# Patient Record
Sex: Female | Born: 1935 | Race: White | Hispanic: No | Smoking: Never smoker
Health system: Southern US, Community
[De-identification: ages and names within clinical notes are randomized; demographics above are authoritative.]

## PROBLEM LIST (undated history)

## (undated) DIAGNOSIS — M810 Age-related osteoporosis without current pathological fracture: Secondary | ICD-10-CM

## (undated) DIAGNOSIS — S065XAA Traumatic subdural hemorrhage with loss of consciousness status unknown, initial encounter: Secondary | ICD-10-CM

## (undated) DIAGNOSIS — N189 Chronic kidney disease, unspecified: Secondary | ICD-10-CM

## (undated) DIAGNOSIS — S065X9A Traumatic subdural hemorrhage with loss of consciousness of unspecified duration, initial encounter: Secondary | ICD-10-CM

## (undated) DIAGNOSIS — G309 Alzheimer's disease, unspecified: Secondary | ICD-10-CM

## (undated) DIAGNOSIS — F0280 Dementia in other diseases classified elsewhere without behavioral disturbance: Secondary | ICD-10-CM

## (undated) DIAGNOSIS — F419 Anxiety disorder, unspecified: Secondary | ICD-10-CM

---

## 2011-02-04 ENCOUNTER — Observation Stay (HOSPITAL_COMMUNITY)
Admission: EM | Admit: 2011-02-04 | Discharge: 2011-02-07 | DRG: 057 | Disposition: A | Discharge status: Nursing Home (Includes ICF) | Payer: Medicare Other | Attending: Internal Medicine | Admitting: Internal Medicine

## 2011-02-04 ENCOUNTER — Emergency Department (HOSPITAL_COMMUNITY): Payer: Medicare Other

## 2011-02-04 DIAGNOSIS — Z9183 Wandering in diseases classified elsewhere: Secondary | ICD-10-CM | POA: Insufficient documentation

## 2011-02-04 DIAGNOSIS — F0390 Unspecified dementia without behavioral disturbance: Principal | ICD-10-CM | POA: Insufficient documentation

## 2011-02-04 DIAGNOSIS — R9431 Abnormal electrocardiogram [ECG] [EKG]: Secondary | ICD-10-CM | POA: Insufficient documentation

## 2011-02-04 DIAGNOSIS — M4120 Other idiopathic scoliosis, site unspecified: Secondary | ICD-10-CM | POA: Insufficient documentation

## 2011-02-04 DIAGNOSIS — G319 Degenerative disease of nervous system, unspecified: Secondary | ICD-10-CM | POA: Insufficient documentation

## 2011-02-04 DIAGNOSIS — M47814 Spondylosis without myelopathy or radiculopathy, thoracic region: Secondary | ICD-10-CM | POA: Insufficient documentation

## 2011-02-04 DIAGNOSIS — M899 Disorder of bone, unspecified: Secondary | ICD-10-CM | POA: Insufficient documentation

## 2011-02-04 DIAGNOSIS — I739 Peripheral vascular disease, unspecified: Secondary | ICD-10-CM | POA: Insufficient documentation

## 2011-02-04 DIAGNOSIS — F0280 Dementia in other diseases classified elsewhere without behavioral disturbance: Secondary | ICD-10-CM | POA: Insufficient documentation

## 2011-02-04 LAB — COMPREHENSIVE METABOLIC PANEL
ALT: 15 U/L (ref 0–35)
AST: 27 U/L (ref 0–37)
CO2: 27 mEq/L (ref 19–32)
Calcium: 9.9 mg/dL (ref 8.4–10.5)
Chloride: 103 mEq/L (ref 96–112)
Creatinine, Ser: 0.76 mg/dL (ref 0.4–1.2)
GFR calc non Af Amer: >60 mL/min (ref >60)
Glucose, Bld: 104 mg/dL — ABNORMAL HIGH (ref 70–99)
Sodium: 140 mEq/L (ref 135–145)
Total Bilirubin: 0.7 mg/dL (ref 0.3–1.2)

## 2011-02-04 LAB — URINALYSIS, ROUTINE W REFLEX MICROSCOPIC
Bilirubin Urine: NEGATIVE
Nitrite: NEGATIVE
Urobilinogen, UA: 0.2 mg/dL (ref 0.0–1.0)
pH: 5 (ref 5.0–8.0)

## 2011-02-04 LAB — DIFFERENTIAL
Lymphocytes Relative: 33 % (ref 12–46)
Monocytes Absolute: 0.4 10*3/uL (ref 0.1–1.0)
Monocytes Relative: 8 % (ref 3–12)
Neutro Abs: 2.7 10*3/uL (ref 1.7–7.7)
Neutrophils Relative %: 57 % (ref 43–77)

## 2011-02-04 LAB — CBC
HCT: 39.2 % (ref 36.0–46.0)
Hemoglobin: 13.5 g/dL (ref 12.0–15.0)
MCH: 31.3 pg (ref 26.0–34.0)
MCHC: 34.4 g/dL (ref 30.0–36.0)
RBC: 4.32 MIL/uL (ref 3.87–5.11)

## 2011-02-04 LAB — URINE MICROSCOPIC-ADD ON

## 2011-02-05 LAB — VITAMIN B12: Vitamin B-12: 270 pg/mL (ref 211–911)

## 2011-02-06 LAB — RPR: RPR Ser Ql: NONREACTIVE

## 2011-02-19 NOTE — Discharge Summary (Signed)
Patricia, Duncan              ACCOUNT NO.:  [id number]  MEDICAL RECORD NO.:  [id number]           PATIENT TYPE:  I  LOCATION:  1502                         FACILITY:  [facility]  PHYSICIAN:  Jeoffrey Massed, MD    DATE OF BIRTH:  07-Jan-1924  DATE OF ADMISSION:  02/04/2011 DATE OF DISCHARGE:                        DISCHARGE SUMMARY - REFERRING   PRIMARY CARE PRACTITIONER:  Does not have one.  PRIMARY DISCHARGE DIAGNOSES:  Progressive dementia, likely Alzheimer's.  DISCHARGE MEDICATIONS: 1. Aspirin 81 mg 1 tablet p.o. daily. 2. Colace 100 mg 1 tablet p.o. twice daily. 3. Aricept 5 mg 1 tablet p.o. daily at bedtime. 4. Lorazepam 1 mg p.o. q.6 h. p.r.n.  CONSULTATIONS:  None.  BRIEF HISTORY OF PRESENT ILLNESS:  The patient is a very pleasant 77- year-old female who apparently has a longstanding history of dementia, around 6 years, who was residing in [place] and was moved here to [place] to be closer to one of her sons.  Further history obtained by the admitting physician.  Patient is mostly confused, and this is her baseline, and tends to wander and has left the patient's son's home on a few occasions, requiring some assistance from the police.  Because of the inability to safely care for the patient at home, the patient was brought into the emergency department for admission and placement to a facility.  For further details, please see the history and physical that was dictated by Dr. Amador Cunas on admission.  PERTINENT RADIOLOGICAL STUDIES: 1. CT of the head showed no acute findings.  Advanced small vessel     disease and atrophy. 2. X-ray of the chest showed mild hyperexpansion of the lungs with no     acute abnormality.  PERTINENT LABORATORY DATA: 1. RPR was nonreactive. 2. Vitamin B12 was 270. 3. TSH was 3.204. 4. Urine analysis was unremarkable. 5. CBC shows a WBC of 4.7, hemoglobin of 13.5, hematocrit of 39.2, and     a platelet count of 209. 6. Chemistry  shows a sodium of 140, potassium of 3.5, chloride of 103,     bicarb of 27, glucose of 104, BUN of 15, creatinine of 0.76.  BRIEF HOSPITAL COURSE:  Advanced dementia.  The patient was brought in for altered mental status.  Apparently, this has been going on for the past 6 years and the family was having a difficult time meeting her needs at home safely.  Therefore, she was admitted.  A brief dementia workup was done which is noted as above.  She has been placed on Aricept.  Here in the hospital, her course was pretty uncomplicated. She has required no restraints, as she is pleasantly confused at baseline.  She has had no requirements for lorazepam as far as I can tell, as well.  Current plans are for her to be transferred to an assisted living facility when a bed is available.  DISPOSITION:  Patient will be transferred to an assisted living facility when a bed is available.  CODE STATUS:  Patient is a do not resuscitate/no code blue.  TOTAL TIME SPENT:  25 minutes.     Jeoffrey Massed, MD  SG/MEDQ  D:  02/07/2011  T:  02/07/2011  Job:  [id number]  cc:   Gordy Savers, MD [address] [place] [address]  Electronically Signed by Jeoffrey Massed  on 02/19/2011 04:23:58 PM

## 2011-02-27 NOTE — H&P (Signed)
[name], [name]              ACCOUNT NO.:  [id number]  MEDICAL RECORD NO.:  [id number]           PATIENT TYPE:  I  LOCATION:  TC08                         FACILITY:  [facility]  PHYSICIAN:  Gordy Savers, MDDATE OF BIRTH:  02-18-1924  DATE OF ADMISSION:  02/04/2011 DATE OF DISCHARGE:                             HISTORY & PHYSICAL   CHIEF COMPLAINT:  Altered mental status.  HISTORY OF PRESENT ILLNESS:  The patient is an 77 year old female, who has a history of progressive dementia over for at least the past 6 years.  For the past week, she has been residing with her son and has been a resident of [place].  The patient's mental status has progressed to the point where she has become much more confused and at times aggressive.  She tends to wander and has left their home and has required some assistance from the police to locate the patient.  She apparently drove a private vehicle until last year when she lost herdriving privileges.  Her primary care provider has prescribed low-dose aspirin and Aricept, which she has refused to take in the past.  She apparently has no significant medical issues and takes no chronic medications.  The son has been in contact with [facility] and due to worsening mental status changes and inability to safely care for the patient at home, the patient was brought to the emergency department for admission and placement.  PAST MEDICAL HISTORY:  Progressive dementia.  SURGICAL HISTORY:  Unclear.  SOCIAL HISTORY:  Resident of [place].  Has been living with her son for the past week.  MEDICATIONS:  She takes no medications.  ALLERGIES:  There are no known drug allergies.  FAMILY HISTORY:  Unobtainable at this time.  REVIEW OF SYSTEMS EXAMINATION:  CONSTITUTIONAL:  Fairly noncontributory. According to the patient's son, she has been medically stable. HEENT:  There has been no history of headaches, visual loss.  The patient does wear  dentures. PULMONARY:  No history of COPD, recent chest pain or shortness of breath. CARDIAC:  No known history of MI, congestive heart failure. GI:  Denies any abdominal pain or change in her bowel habits. GENITOURINARY:  Denies any urinary frequency, dysuria, hematuria. NEUROLOGIC/PSYCHIATRIC:  History of progressive senile dementia.  PHYSICAL EXAMINATION:  GENERAL EXAMINATION:  Revealed a well-developed, thin white female, pleasant, but quite confused. VITAL SIGNS:  Blood pressure of 130/80, pulse rate 72, respiratory rate 12, temperature of 98.4, O2 saturation normal. SKIN:  Warm and dry without rash. HEENT EXAMINATION:  Revealed no signs of head trauma.  Pupil responses were normal.  Extraocular muscles were full.  There is no facial asymmetry.  Oropharynx benign, although exam limited.  Dentures were in place. NECK:  No bruits, adenopathy or neck vein distention. CHEST:  Clear. BREASTS:  Negative. CARDIOVASCULAR:  Normal S1, S2.  No murmurs, gallops. ABDOMEN:  Soft and nontender without organomegaly.  A midline scar noted.  No bruits were appreciated. EXTREMITIES:  Revealed no edema, clubbing or cyanosis.  Pedal pulses were full. NEUROLOGIC EXAMINATION:  Revealed the patient to be alert, but quite confused and confabulatory.  She was not  oriented to place.  Motor exam was nonfocal.  She is able to move all extremities and ambulate without difficulty.  Plantar responses were flexor.  LABORATORY DATA:  Laboratory studies included CBC that revealed a normal white count of 4.7, hemoglobin 13.5.  Chemistries were unremarkable as was the urinalysis.  DIAGNOSTIC STUDIES:  Head CT without contrast revealed advanced small- vessel disease in the periventricular white matter regions and diffuse cortical atrophy.  No acute findings were noted.  Chest x-ray revealed mild hyperinflation of the lungs, but no acute abnormalities.  IMPRESSION: 1. Progressive dementia, probably vascular  dementia. 2. Dependence in all aspects of daily living.  DISPOSITION:  The patient will be admitted to the hospital to assist with nursing home placement.  A social worker consult will be obtained. The patient will be placed on daily aspirin and Aricept 5 mg daily.  DVT prophylaxis will be instituted.  The patient's prior DNR status will be maintained.     Gordy Savers, MD     PFK/MEDQ  D:  02/04/2011  T:  02/04/2011  Job:  [id number]  Electronically Signed by Eleonore Chiquito MD on 02/27/2011 12:27:17 PM

## 2011-03-10 ENCOUNTER — Inpatient Hospital Stay (HOSPITAL_COMMUNITY): Payer: Medicare Other

## 2011-03-10 ENCOUNTER — Inpatient Hospital Stay (HOSPITAL_COMMUNITY)
Admission: EM | Admit: 2011-03-10 | Discharge: 2011-03-16 | DRG: 481 | Disposition: A | Discharge status: Skilled Nursing Facility | Payer: Medicare Other | Attending: Internal Medicine | Admitting: Internal Medicine

## 2011-03-10 ENCOUNTER — Emergency Department (HOSPITAL_COMMUNITY): Payer: Medicare Other

## 2011-03-10 DIAGNOSIS — D62 Acute posthemorrhagic anemia: Secondary | ICD-10-CM | POA: Diagnosis not present

## 2011-03-10 DIAGNOSIS — S72143A Displaced intertrochanteric fracture of unspecified femur, initial encounter for closed fracture: Principal | ICD-10-CM | POA: Diagnosis present

## 2011-03-10 DIAGNOSIS — G309 Alzheimer's disease, unspecified: Secondary | ICD-10-CM | POA: Diagnosis present

## 2011-03-10 DIAGNOSIS — Y9210 Unspecified residential institution as the place of occurrence of the external cause: Secondary | ICD-10-CM | POA: Diagnosis present

## 2011-03-10 DIAGNOSIS — R404 Transient alteration of awareness: Secondary | ICD-10-CM | POA: Diagnosis not present

## 2011-03-10 DIAGNOSIS — S72009A Fracture of unspecified part of neck of unspecified femur, initial encounter for closed fracture: Secondary | ICD-10-CM

## 2011-03-10 DIAGNOSIS — Z66 Do not resuscitate: Secondary | ICD-10-CM | POA: Diagnosis present

## 2011-03-10 DIAGNOSIS — I951 Orthostatic hypotension: Secondary | ICD-10-CM | POA: Diagnosis not present

## 2011-03-10 DIAGNOSIS — F0280 Dementia in other diseases classified elsewhere without behavioral disturbance: Secondary | ICD-10-CM | POA: Diagnosis present

## 2011-03-10 DIAGNOSIS — W19XXXA Unspecified fall, initial encounter: Secondary | ICD-10-CM | POA: Diagnosis present

## 2011-03-10 LAB — BASIC METABOLIC PANEL
BUN: 14 mg/dL (ref 6–23)
CO2: 28 mEq/L (ref 19–32)
Chloride: 104 mEq/L (ref 96–112)
GFR calc non Af Amer: >60 mL/min (ref >60)
Glucose, Bld: 148 mg/dL — ABNORMAL HIGH (ref 70–99)
Potassium: 4.1 mEq/L (ref 3.5–5.1)
Sodium: 137 mEq/L (ref 135–145)

## 2011-03-10 LAB — GLUCOSE, CAPILLARY: Glucose-Capillary: 200 mg/dL — ABNORMAL HIGH (ref 70–99)

## 2011-03-10 LAB — DIFFERENTIAL
Basophils Absolute: 0 10*3/uL (ref 0.0–0.1)
Lymphocytes Relative: 10 % — ABNORMAL LOW (ref 12–46)
Lymphs Abs: 1 10*3/uL (ref 0.7–4.0)
Neutro Abs: 8.7 10*3/uL — ABNORMAL HIGH (ref 1.7–7.7)
Neutrophils Relative %: 84 % — ABNORMAL HIGH (ref 43–77)

## 2011-03-10 LAB — CBC
HCT: 35.3 % — ABNORMAL LOW (ref 36.0–46.0)
Hemoglobin: 11.3 g/dL — ABNORMAL LOW (ref 12.0–15.0)
MCV: 95.4 fL (ref 78.0–100.0)
RBC: 3.7 MIL/uL — ABNORMAL LOW (ref 3.87–5.11)
RDW: 13.6 % (ref 11.5–15.5)
WBC: 10.3 10*3/uL (ref 4.0–10.5)

## 2011-03-11 ENCOUNTER — Inpatient Hospital Stay (HOSPITAL_COMMUNITY): Payer: Medicare Other

## 2011-03-11 LAB — CBC
HCT: 25.1 % — ABNORMAL LOW (ref 36.0–46.0)
Hemoglobin: 8.2 g/dL — ABNORMAL LOW (ref 12.0–15.0)
MCH: 30.8 pg (ref 26.0–34.0)
MCHC: 32.7 g/dL (ref 30.0–36.0)
MCHC: 32 g/dL (ref 30.0–36.0)
MCV: 94.4 fL (ref 78.0–100.0)
Platelets: 170 10*3/uL (ref 150–400)
Platelets: 146 10*3/uL — ABNORMAL LOW (ref 150–400)
RBC: 2.66 MIL/uL — ABNORMAL LOW (ref 3.87–5.11)
RDW: 13.7 % (ref 11.5–15.5)
RDW: 13.7 % (ref 11.5–15.5)
WBC: 13.6 10*3/uL — ABNORMAL HIGH (ref 4.0–10.5)
WBC: 9.7 10*3/uL (ref 4.0–10.5)

## 2011-03-11 LAB — PROTIME-INR
INR: 1.17 (ref 0.00–1.49)
Prothrombin Time: 15.1 seconds (ref 11.6–15.2)

## 2011-03-11 LAB — CARDIAC PANEL(CRET KIN+CKTOT+MB+TROPI)
CK, MB: 5.1 ng/mL — ABNORMAL HIGH (ref 0.3–4.0)
Relative Index: 1.7 (ref 0.0–2.5)
Total CK: 295 U/L — ABNORMAL HIGH (ref 7–177)
Total CK: 327 U/L — ABNORMAL HIGH (ref 7–177)
Troponin I: 0.01 ng/mL (ref 0.00–0.06)

## 2011-03-11 LAB — COMPREHENSIVE METABOLIC PANEL
ALT: 20 U/L (ref 0–35)
AST: 34 U/L (ref 0–37)
CO2: 26 mEq/L (ref 19–32)
Chloride: 103 mEq/L (ref 96–112)
GFR calc Af Amer: >60 mL/min (ref >60)
GFR calc non Af Amer: 60 mL/min — ABNORMAL LOW (ref >60)
Glucose, Bld: 204 mg/dL — ABNORMAL HIGH (ref 70–99)
Sodium: 137 mEq/L (ref 135–145)
Total Bilirubin: 0.6 mg/dL (ref 0.3–1.2)

## 2011-03-12 LAB — IRON AND TIBC
Iron: 20 ug/dL — ABNORMAL LOW (ref 42–135)
UIBC: 165 ug/dL

## 2011-03-12 LAB — CBC
HCT: 19 % — ABNORMAL LOW (ref 36.0–46.0)
Hemoglobin: 6.1 g/dL — CL (ref 12.0–15.0)
MCH: 30 pg (ref 26.0–34.0)
MCHC: 32.1 g/dL (ref 30.0–36.0)
MCV: 93.6 fL (ref 78.0–100.0)

## 2011-03-12 LAB — PROTIME-INR: INR: 1.2 (ref 0.00–1.49)

## 2011-03-12 LAB — ABO/RH: ABO/RH(D): O POS

## 2011-03-12 LAB — HEMOGLOBIN AND HEMATOCRIT, BLOOD
HCT: 25.6 % — ABNORMAL LOW (ref 36.0–46.0)
Hemoglobin: 8.8 g/dL — ABNORMAL LOW (ref 12.0–15.0)

## 2011-03-13 LAB — PROTIME-INR
INR: 1 (ref 0.00–1.49)
Prothrombin Time: 13.4 seconds (ref 11.6–15.2)

## 2011-03-13 LAB — CROSSMATCH
Antibody Screen: NEGATIVE
Unit division: 0

## 2011-03-13 LAB — BASIC METABOLIC PANEL
GFR calc Af Amer: >60 mL/min (ref >60)
GFR calc non Af Amer: >60 mL/min (ref >60)
Potassium: 3.5 mEq/L (ref 3.5–5.1)
Sodium: 138 mEq/L (ref 135–145)

## 2011-03-13 LAB — MAGNESIUM: Magnesium: 2.1 mg/dL (ref 1.5–2.5)

## 2011-03-13 LAB — CBC
Hemoglobin: 9.8 g/dL — ABNORMAL LOW (ref 12.0–15.0)
RBC: 3.27 MIL/uL — ABNORMAL LOW (ref 3.87–5.11)

## 2011-03-14 LAB — PROTIME-INR
INR: 1.04 (ref 0.00–1.49)
Prothrombin Time: 13.8 seconds (ref 11.6–15.2)

## 2011-03-14 LAB — CBC
MCHC: 33.5 g/dL (ref 30.0–36.0)
RDW: 14.7 % (ref 11.5–15.5)

## 2011-03-15 LAB — BASIC METABOLIC PANEL
CO2: 28 mEq/L (ref 19–32)
Chloride: 104 mEq/L (ref 96–112)
GFR calc Af Amer: >60 mL/min (ref >60)
Potassium: 3.9 mEq/L (ref 3.5–5.1)
Sodium: 136 mEq/L (ref 135–145)

## 2011-03-15 LAB — PROTIME-INR: Prothrombin Time: 14.7 seconds (ref 11.6–15.2)

## 2011-03-15 LAB — CBC
MCH: 30.2 pg (ref 26.0–34.0)
Platelets: 166 10*3/uL (ref 150–400)
RBC: 2.81 MIL/uL — ABNORMAL LOW (ref 3.87–5.11)
WBC: 3.8 10*3/uL — ABNORMAL LOW (ref 4.0–10.5)

## 2011-03-15 NOTE — Op Note (Signed)
[name], [name]              ACCOUNT NO.:  [id number]  MEDICAL RECORD NO.:  [id number]           PATIENT TYPE:  I  LOCATION:  1601                         FACILITY:  [facility]  PHYSICIAN:  Alvy Beal, MD    DATE OF BIRTH:  02-17-1924  DATE OF PROCEDURE: DATE OF DISCHARGE:                              OPERATIVE REPORT   PREOPERATIVE DIAGNOSIS:  Left hip comminuted intertrochanteric fracture.  POSTOPERATIVE DIAGNOSIS:  Left hip comminuted intertrochanteric fracture.  OPERATIVE PROCEDURE:  Open reduction and internal fixation with troch nail, left hip.  COMPLICATIONS:  None.  CONDITION:  Stable.  HISTORY:  This is an 77 year old demented woman who fell today and presented to the ER with a left intertrochanteric comminuted hip fracture.  Preoperative medical clearance was obtained and decision was then made to take the patient to the operating room for the aforementioned procedure.  All pertinent risks, benefits and alternatives were discussed with the patient and her son and consent was obtained.  OPERATIVE NOTE:  The patient was brought to the operating room, placed supine on the operating room table.  After successful induction of general anesthesia, endotracheal intubation, the right lower extremity was placed in a well leg holder and flexed in a abductor position.  The left was placed into the traction boot.  A gentle traction distraction maneuver was made to reduce the femur.  Once the hip fracture was reduced, we then prepped and draped the leg in a standard fashion. Appropriate time-out was done to confirm patient procedure on all other pertinent port data.  Once this was done, a small 1-inch incision was made just superior to the greater troch and I bluntly dissected through the deep fascia.  I placed a guide pin to the greater troch.  No significant comminution over the greater troch.  I then identified the proper position and advanced the guide pin into the  proximal femur.  I confirmed satisfactory position in both the AP and lateral planes.  Once this was done, I used the one-step reaming drill lead over the guide pin.  The fracture was still satisfactorily reduced at this time.  I then advanced the guide reaming pin all the way down to the superior aspect of the patella.  I then measured and obtained a 11 x 360 trochanteric Synthes nail.  I then reamed with an 8.5 in-cutting and then a size 11 reamer.  There was no significant chatter.  At this point, I then advanced the nail to the appropriate depth.  Once it was sized appropriately, the guide pin was removed and I made a second excision on the lateral aspect of the femur.  I placed the aiming device on the lateral side of the femur and advanced the guide pin through the nail and into the head of the femur.  I confirmed satisfactory trajectory in both AP and lateral planes.  I then measured and drilled and placed a 105 lag screw.  At this point, I then removed the traction and provided some compression at the fracture site.  At this point, I was satisfied with the reduction.  I locked the lag  screw and then backed off a quarter turn.  I then obtained perfect circles in a lateral to distal static screw and made a third incision at the lateral aspect of the femur.  I then placed a drill across, measured and placed a 40 mm statically locked distal screw.  Final AP and lateral x-rays were done. It was satisfactory.  I then irrigated all 3 wounds, closed the deep fascia with interrupted 0 Vicryl suture, superficial 2-0 Vicryl sutures and staples for skin.  Dry dressings were applied to each of the 3 wounds as was 0.25% plain Marcaine.  At this point, the patient was then extubated and transferred to PACU without incident.  At the end of the case, all needle and sponge counts were correct.  There were no adverse intraoperative events.     Alvy Beal, MD     DDB/MEDQ  D:   03/10/2011  T:  03/10/2011  Job:  [id number]  Electronically Signed by Venita Lick MD on 03/15/2011 11:47:52 AM

## 2011-03-15 NOTE — H&P (Signed)
  [name], BALSTER              ACCOUNT NO.:  [id number]  MEDICAL RECORD NO.:  [id number]           PATIENT TYPE:  E  LOCATION:  WLED                         FACILITY:  [facility]  PHYSICIAN:  Alvy Beal, MD    DATE OF BIRTH:  19-Dec-1924  DATE OF ADMISSION:  03/10/2011 DATE OF DISCHARGE:                             HISTORY & PHYSICAL   REASON FOR CONSULTATION:  Fall with left hip fracture.  HISTORY:  This is an 77 year old woman with a medical history of Alzheimer's disease who fell earlier today.  The patient was brought in by her son with inability to ambulate, a grossly internally rotated lower extremity and complaints of left hip pain.  The patient denied any other complaints.  She also denied any loss of consciousness, headaches or blurry vision at present.  As a result of her age and underlying Alzheimer's, a medical consultation was requested.  PAST MEDICAL HISTORY:  The patient's medical history includes Alzheimer's disease.SOCIAL HISTORY:  She is a nonsmoker, nondrinker, non drug abuser.  She lives in a nursing home.  ALLERGIES:  SHE HAS NO KNOWN DRUG ALLERGIES.  MEDICATIONS:  She is currently just taking aspirin, Colace, Aricept, Ativan.  REVIEW OF SYSTEMS:  Review of systems is only significant for the Alzheimer's.  No other significant abnormalities.  RADIOLOGIC:  Chest x-ray was read by the radiologist and that was no evidence of acute cardiopulmonary disease.  Left hip was a comminuted, four-part intertrochanteric fracture with displacement of the lesser and greater trochanter.  No dislocation.  No extension.  No femoral neck fracture.  PHYSICAL EXAMINATION:  She is a pleasant woman who appears her stated age.  She is confused, which is her baseline (disoriented to place and time).  She has no shortness of breath or chest pain.  The abdomen is soft and nontender.  Compartments are soft and nontender.  Palpable dorsalis pedis and posterior tibialis  pulses bilaterally.  EHL, tibialis anterior, gastrocnemius are all intact.  No knee pain.  No ankle pain. She has a grossly deformed left hip, which is tender to palpation.  No obvious laceration, contusion or abrasion.  Right lower extremity is asymptomatic.  She is moving both upper extremities with no pain.  ASSESSMENT AND PLAN:  At this point in time the patient has a four-part intertrochanteric fracture.  I have spoken with the son over the phone who is in [place] and her son who is here with her and I have explained the risks, benefits and alternatives to surgery.  The risks include infection, bleeding, nerve damage, death, stroke, paralysis, hardware failure, refracture, nonunion or malunion and the fracture does not heal or does not heal correctly and then the need for further surgery.  All of their questions were addressed.  Consent will be obtained and we will plan on doing surgery this afternoon.     Alvy Beal, MD     DDB/MEDQ  D:  03/10/2011  T:  03/10/2011  Job:  [id number]  cc:   Alvy Beal, MD Fax: [phone]  Electronically Signed by Venita Lick MD on 03/15/2011 11:47:49 AM

## 2011-03-16 LAB — PROTIME-INR
INR: 1.25 (ref 0.00–1.49)
Prothrombin Time: 15.9 seconds — ABNORMAL HIGH (ref 11.6–15.2)

## 2011-03-16 NOTE — Discharge Summary (Signed)
[name], [name]              ACCOUNT NO.:  [id number]  MEDICAL RECORD NO.:  [id number]           PATIENT TYPE:  I  LOCATION:  1406                         FACILITY:  [facility]  PHYSICIAN:  Isidor Holts, M.D.  DATE OF BIRTH:  Feb 21, 1924  DATE OF ADMISSION:  03/10/2011 DATE OF DISCHARGE:  03/16/2011                        DISCHARGE SUMMARY - REFERRING   DISCHARGE DIAGNOSES: 1. Fall, complicated by left hip fracture. 2. Comminuted left hip intertrochanteric fracture, status post open     reduction internal fixation March 10, 2011. 3. Acute blood loss anemia secondary to #1 and 2 above. Required     transfusion 2 units packed red blood cells on March 12, 2011. 4. Dementia. 5. Delirium.  DISCHARGE MEDICATIONS: 1. Ferrous sulfate 325 mg p.o. t.i.d. after meals. 2. Tramadol 25 mg p.o. p.r.n. q.8 hourly for pain. 3. Coumadin 4 mg at 6:00 p.m. daily, otherwise per INR. 4. Zolpidem 5 mg p.o. p.r.n. at bedtime for insomnia. 5. Aricept 5 mg p.o. at bedtime. 6. Ativan 1 mg p.o. b.i.d. 7. Chewable aspirin 81 mg p.o. daily. 8. Colace 100 mg p.o. b.i.d.  PROCEDURES: 1. X-ray left hip March 10, 2011:  This showed comminuted     intertrochanteric proximal left femur fracture with varus     angulation. 2. Chest x-ray March 10, 2011:  This showed no evidence of acute     cardiopulmonary process, upper limits of normal heart size, and     chronic interstitial prominence. 3. Repeat pelvic x-ray March 10, 2011:  This showed internal fixation     of left femoral fracture without gross complicating features. 4. Head CT scan March 11, 2011:  This showed no evidence of traumatic     intracranial injury or fracture, moderate cortical volume loss and     scattered small-vessel ischemic microangiopathy. 5. 2-D echocardiogram March 13, 2011:  This showed normal left     ventricular cavity size, mild concentric hypertrophy, normal     systolic function with an EF of 60% to 65% with no regional  wall     motion abnormality.  Doppler parameters were consistent with grade     1 diastolic dysfunction.  There was moderate tricuspid     regurgitation; pulmonary artery peak pressure was 36 mmHg.  ORTHOPEDIC SURGEON:  Patricia Lick, MD  ADMISSION HISTORY:  As in H and P notes of March 10, 2011, dictated by Dr. Venita Duncan.  However, in brief, this is an 77 year old female, with known history of Alzheimer's disease, resident of assisted living facility, history of previous surgery for bowel obstruction and gynecologic surgery in the 1970s, presenting following a fall, complicated by left hip comminuted intertrochanteric fracture.  She was admitted for further evaluation, investigation and management.  Medical team were requested to consult on March 10, 2011 for preoperative evaluation.  There were no contraindications to surgery.  CLINICAL COURSE: 1. Left hip fracture:  The patient was taken to the OR on March 10, 2011, and had an open reduction internal fixation with     nail of the left hip, in an uncomplicated procedure.  Thereafter,she was managed with  analgesics, PT, OT.  Clinical condition remained     stable.  2. Acute blood loss anemia:  This was secondary to #1 above.  The     patient's hemoglobin which was about 11.3 on March 10, 2011, but by     March 12, 2011, dropped to 6.1.  She was transfused with 2 units of     PRBC on that date, resulting in the posttransfusion bump in     hemoglobin to 9.8 on March 13, 2011.  Hemoglobin has remained     stable since and was 8.5 on March 15, 2011.  The patient has been     placed on iron supplements accordingly.  3. Dementia:  The patient has severe dementia and is currently on     Aricept.  4. Delirium:  The patient did experience episodes of sundowning and     confusion in the next couple of days postoperatively, this was     felt to be secondary to a combination of opiate analgesics,     sundowning, as well as anemia,  against the background of the     patient's acute medical problems. She was managed with p.r.n.     Haldol.  By March 14, 2011, delirium appeared to have subsided, and     the patient was totally calm as of March 15, 2011.  DISPOSITION:  The patient, as of March 15, 2011, was considered clinically stable for discharge to a skilled nursing facility for continued rehabilitation.  The patient was transferred to [facility] for appropriate disposition on March 12, 2011, and provided appropriate bed can be found, the patient will be discharged to [facility] on March 16, 2011.  ACTIVITY: Recommended to increase activity slowly, otherwise, per PT/OT/Rehab.  DIET:  No restrictions.  FOLLOW UP INSTRUCTIONS:  Follow up with SNF MD. Also, follow up with Patricia Lick, MD. Orthopedic surgeon in 2 weeks. Tel: [phone].     Isidor Holts, M.D.     CO/MEDQ  D:  03/15/2011  T:  03/15/2011  Job:  [id number]  Electronically Signed by Isidor Holts M.D. on 03/16/2011 07:23:29 PM

## 2011-03-24 ENCOUNTER — Emergency Department (HOSPITAL_COMMUNITY)
Admission: EM | Admit: 2011-03-24 | Discharge: 2011-03-25 | Disposition: A | Discharge status: Other Acute Inpatient Hospital | Payer: Medicare Other | Source: Home / Self Care | Attending: Emergency Medicine | Admitting: Emergency Medicine

## 2011-03-24 ENCOUNTER — Emergency Department (HOSPITAL_COMMUNITY): Payer: Medicare Other

## 2011-03-24 DIAGNOSIS — Z7901 Long term (current) use of anticoagulants: Secondary | ICD-10-CM | POA: Insufficient documentation

## 2011-03-24 DIAGNOSIS — W07XXXA Fall from chair, initial encounter: Secondary | ICD-10-CM | POA: Insufficient documentation

## 2011-03-24 DIAGNOSIS — Y9210 Unspecified residential institution as the place of occurrence of the external cause: Secondary | ICD-10-CM | POA: Insufficient documentation

## 2011-03-24 DIAGNOSIS — I6200 Nontraumatic subdural hemorrhage, unspecified: Secondary | ICD-10-CM | POA: Insufficient documentation

## 2011-03-24 DIAGNOSIS — Z9889 Other specified postprocedural states: Secondary | ICD-10-CM | POA: Insufficient documentation

## 2011-03-24 DIAGNOSIS — S42009A Fracture of unspecified part of unspecified clavicle, initial encounter for closed fracture: Secondary | ICD-10-CM | POA: Insufficient documentation

## 2011-03-24 DIAGNOSIS — Z66 Do not resuscitate: Secondary | ICD-10-CM | POA: Insufficient documentation

## 2011-03-24 DIAGNOSIS — M25559 Pain in unspecified hip: Secondary | ICD-10-CM | POA: Insufficient documentation

## 2011-03-24 DIAGNOSIS — G309 Alzheimer's disease, unspecified: Secondary | ICD-10-CM | POA: Insufficient documentation

## 2011-03-24 DIAGNOSIS — F0280 Dementia in other diseases classified elsewhere without behavioral disturbance: Secondary | ICD-10-CM | POA: Insufficient documentation

## 2011-03-24 LAB — DIFFERENTIAL
Basophils Absolute: 0.1 10*3/uL (ref 0.0–0.1)
Lymphocytes Relative: 8 % — ABNORMAL LOW (ref 12–46)
Lymphs Abs: 0.7 10*3/uL (ref 0.7–4.0)
Neutrophils Relative %: 83 % — ABNORMAL HIGH (ref 43–77)

## 2011-03-24 LAB — CBC
HCT: 28.5 % — ABNORMAL LOW (ref 36.0–46.0)
MCV: 93.4 fL (ref 78.0–100.0)
Platelets: 523 10*3/uL — ABNORMAL HIGH (ref 150–400)
RBC: 3.05 MIL/uL — ABNORMAL LOW (ref 3.87–5.11)
RDW: 15.1 % (ref 11.5–15.5)
WBC: 8.8 10*3/uL (ref 4.0–10.5)

## 2011-03-24 NOTE — Consult Note (Signed)
NAME:  [name], [name]              ACCOUNT NO.:  [id number]  MEDICAL RECORD NO.:  [id number]           PATIENT TYPE:  LOCATION:                                 FACILITY:  PHYSICIAN:  Calvert Cantor, M.D.     DATE OF BIRTH:  February 22, 1924  DATE OF CONSULTATION: DATE OF DISCHARGE:                                CONSULTATION   PRIMARY CARE PHYSICIAN:  The patient resides at an assisted living facility and I do not have the name of the PCP at this moment, but she has a physician at that facility.  REQUESTING PHYSICIAN:  Alvy Beal, MD  REASON FOR CONSULTATION:  Hip fracture with Alzheimer dementia.  HISTORY OF PRESENT ILLNESS:  This is an 77 year old female with Alzheimer dementia and resultant short-term memory loss, who was previously residing in [place].  She moved to this area about 2 months ago.  It was difficult for her son to take care of her and eventually, she transitioned to an assisted living facility.  She was sent today after a fall and is noted to have sustained a proximal left femur fracture and will require surgery.  She is currently confused.  She thinks she is still in [place]; therefore, I am unable to obtain a history from her.  She has received pain medications in the ER and is not in any pain currently.  PAST MEDICAL HISTORY:  Alzheimer dementia.  PAST SURGICAL HISTORY:  Son believes she has had a surgery for a bowel obstruction and he thinks she had some sort of female surgery in the 63s.  SOCIAL HISTORY:  She has not smoked.  Does not drink.  Lives in assisted living.  ALLERGIES:  No known drug allergies.  MEDICATIONS:  Per list sent from facility; 1. Aspirin 81 mg daily. 2. Colace 100 mg twice a day. 3. Aricept 5 mg at bedtime. 4. Ativan 1 mg twice a day.  REVIEW OF SYSTEMS:  Not obtainable.  PHYSICAL EXAMINATION:  GENERAL:  Elderly female lying in bed in no acute distress. VITAL SIGNS:  Blood pressure 99/58, pulse 60, respiratory rate  18, temperature 97.4, oxygen saturation is 99% on room air. HEENT:  Pupils are narrow after having received narcotic, but are reactive to light.  Extraocular movements are intact.  Conjunctivae is pink.  No scleral icterus.  Oral mucosa is moist.  Oropharynx clear. NECK:  Supple.  No thyromegaly or lymphadenopathy. HEART:  Regular rate and rhythm.  No murmurs, rubs, or gallops. LUNGS:  Clear bilaterally.  Normal respiratory effort.  No use of accessory muscles. ABDOMEN:  Soft, nontender, nondistended.  Bowel sounds positive. EXTREMITIES:  No cyanosis, clubbing, or edema.  Pedal pulses positive. Left leg is rotated inward. NEUROLOGIC:  Cranial nerves are grossly intact.  She has good strength in upper extremities in her right foot, have not checked her left. PSYCHOLOGIC:  She is awake and alert, but confused to time and place.  LABORATORY DATA:  Blood work; she has a normal metabolic panel.  CBC reveals mild anemia with a hemoglobin of 11.3, hematocrit of 35.3.  X-ray of the left hip reveals comminuted intertrochanteric  proximal left femur fracture with varus angulation.  Chest x-ray reveals no evidence of acute cardiopulmonary disease.  Heart is upper limits of normal.  This was a one-view.  EKG reveals a sinus rhythm at 70 beats per minute with no ST or T-wave changes.  ASSESSMENT AND PLAN: 1. Left hip fracture.  The patient is unable to tell me if she has had     any chest pain due to her dementia, but her EKG appears to be     normal and therefore at this point I do not believe there is any     contraindications for surgery. 2. Alzheimer dementia.  We will resume her Ativan.  We will request a     sitter and monitor for delirium and treat appropriately. 3. The patient is a do not resuscitate per son's request.  She was a     DNR here at [facility] prior to going to her facility.  Deep     venous thrombosis prophylaxis is up to the orthopedic surgeon.  Time taken on this  patient was 50 minutes.     Calvert Cantor, M.D.     SR/MEDQ  D:  03/10/2011  T:  03/10/2011  Job:  [id number]  Electronically Signed by Calvert Cantor M.D. on 03/24/2011 05:07:31 PM

## 2011-03-25 ENCOUNTER — Emergency Department (HOSPITAL_COMMUNITY): Payer: Medicare Other

## 2011-03-25 ENCOUNTER — Inpatient Hospital Stay (HOSPITAL_COMMUNITY): Payer: Medicare Other

## 2011-03-25 ENCOUNTER — Inpatient Hospital Stay (HOSPITAL_COMMUNITY)
Admission: EM | Admit: 2011-03-25 | Discharge: 2011-03-27 | DRG: 086 | Disposition: A | Discharge status: Skilled Nursing Facility | Payer: Medicare Other | Source: Other Acute Inpatient Hospital | Attending: General Surgery | Admitting: General Surgery

## 2011-03-25 DIAGNOSIS — Z7901 Long term (current) use of anticoagulants: Secondary | ICD-10-CM

## 2011-03-25 DIAGNOSIS — F0280 Dementia in other diseases classified elsewhere without behavioral disturbance: Secondary | ICD-10-CM | POA: Diagnosis present

## 2011-03-25 DIAGNOSIS — Z7982 Long term (current) use of aspirin: Secondary | ICD-10-CM

## 2011-03-25 DIAGNOSIS — Y998 Other external cause status: Secondary | ICD-10-CM

## 2011-03-25 DIAGNOSIS — G309 Alzheimer's disease, unspecified: Secondary | ICD-10-CM | POA: Diagnosis present

## 2011-03-25 DIAGNOSIS — Y9210 Unspecified residential institution as the place of occurrence of the external cause: Secondary | ICD-10-CM | POA: Diagnosis present

## 2011-03-25 DIAGNOSIS — G40909 Epilepsy, unspecified, not intractable, without status epilepticus: Secondary | ICD-10-CM | POA: Diagnosis present

## 2011-03-25 DIAGNOSIS — S065X0A Traumatic subdural hemorrhage without loss of consciousness, initial encounter: Principal | ICD-10-CM | POA: Diagnosis present

## 2011-03-25 DIAGNOSIS — W19XXXA Unspecified fall, initial encounter: Secondary | ICD-10-CM | POA: Diagnosis present

## 2011-03-25 DIAGNOSIS — D62 Acute posthemorrhagic anemia: Secondary | ICD-10-CM | POA: Diagnosis present

## 2011-03-25 DIAGNOSIS — IMO0002 Reserved for concepts with insufficient information to code with codable children: Secondary | ICD-10-CM

## 2011-03-25 LAB — COMPREHENSIVE METABOLIC PANEL
ALT: 22 U/L (ref 0–35)
Albumin: 2.9 g/dL — ABNORMAL LOW (ref 3.5–5.2)
Calcium: 8.6 mg/dL (ref 8.4–10.5)
GFR calc Af Amer: >60 mL/min (ref >60)
Glucose, Bld: 103 mg/dL — ABNORMAL HIGH (ref 70–99)
Potassium: 3.7 mEq/L (ref 3.5–5.1)
Sodium: 138 mEq/L (ref 135–145)
Total Protein: 6.3 g/dL (ref 6.0–8.3)

## 2011-03-25 LAB — CBC
HCT: 27.5 % — ABNORMAL LOW (ref 36.0–46.0)
Hemoglobin: 8.8 g/dL — ABNORMAL LOW (ref 12.0–15.0)
MCHC: 32 g/dL (ref 30.0–36.0)
RBC: 2.95 MIL/uL — ABNORMAL LOW (ref 3.87–5.11)
WBC: 4.9 10*3/uL (ref 4.0–10.5)

## 2011-03-25 LAB — PROTIME-INR
INR: 1.72 — ABNORMAL HIGH (ref 0.00–1.49)
INR: 1.25 (ref 0.00–1.49)
Prothrombin Time: 15.9 seconds — ABNORMAL HIGH (ref 11.6–15.2)

## 2011-03-25 LAB — APTT: aPTT: 29 seconds (ref 24–37)

## 2011-03-25 LAB — ABO/RH: ABO/RH(D): O POS

## 2011-03-25 LAB — MRSA PCR SCREENING: MRSA by PCR: NEGATIVE

## 2011-03-26 LAB — PREPARE FRESH FROZEN PLASMA: Unit division: 0

## 2011-03-27 LAB — BASIC METABOLIC PANEL
Calcium: 8.5 mg/dL (ref 8.4–10.5)
Chloride: 106 mEq/L (ref 96–112)
Creatinine, Ser: 0.58 mg/dL (ref 0.4–1.2)
GFR calc Af Amer: >60 mL/min (ref >60)
Sodium: 138 mEq/L (ref 135–145)

## 2011-03-27 NOTE — H&P (Signed)
[name], [name]              ACCOUNT NO.:  [id number]  MEDICAL RECORD NO.:  [id number]           PATIENT TYPE:  I  LOCATION:  3103                         FACILITY:  [facility]  PHYSICIAN:  Sharlet Salina T. Faatima Tench, M.D.DATE OF BIRTH:  10-01-1924  DATE OF ADMISSION:  03/25/2011 DATE OF DISCHARGE:                             HISTORY & PHYSICAL   CHIEF COMPLAINT:  Fall and head injury.  HISTORY OF PRESENT ILLNESS:  [name] is an 77 year old white female nursing home resident who was brought to the [facility] Emergency Department after a fall tonight.  She was subsequently transferred to [facility] to the Trauma Service.  The patient is status post ORIF of a left hip fracture on March 10, 2011, by Dr. Venita Lick.  She has been on Coumadin for DVT prophylaxis since that time.  She apparently had some bleeding and anemia following her hip fracture.  The patient apparently again fell at the nursing home this evening.  She has chronic dementia and is unable to recall the fall or give any history regarding it.  She is, however, alert and has no specific complaints.  She has been stable throughout her course.  PAST MEDICAL HISTORY:  Significant for chronic dementia.  Otherwise, per review of the history, she does not appear to have any serious medical problems.  PAST SURGERIES:  Per the records significant for laparotomy for small bowel obstruction apparently some years ago as well as ORIF of her left hip on March 10, 2011, as above.  MEDICATIONS PRIOR TO ADMISSION: 1. Coumadin 5 mg a day. 2. Diflucan 100 mg for 3 days. 3. Ativan 1 mg b.i.d. and 0.5 t.i.d. p.r.n. 4. FeSO4 325 a day. 5. Tramadol 25 mg daily. 6. Zolpidem 5 mg at night p.r.n. 7. Aricept 5 daily. 8. Aspirin 81 mg a day.  ALLERGIES:  No known drug allergies.  SOCIAL HISTORY:  A nursing home resident.  No history of cigarette or alcohol use.  FAMILY HISTORY:  Noncontributory.  REVIEW OF SYSTEMS:   Limited due to mental status, but denies headache, chest pain, shortness of breath, abdominal pain, extremity pain, or any other complaints.  PHYSICAL EXAMINATION:  VITAL SIGNS:  Temperature is 98, pulse 79, respirations 13, blood pressure 144/78, and O2 sats 97%. GENERAL:  She is an alert, cooperative, elderly white female, confused but follows commands and is conversant. SKIN:  Somewhat atrophic, warm and dry. HEENT:  Cranium is atraumatic and no bruising or swelling.  Face is stable without swelling or tenderness.  Pupils are 2 mm bilaterally and reactive.  EOMs intact.  She has poor dentition.  Mucous membranes moist.  No neck masses or thyromegaly. LYMPH NODES:  No cervical, subclavicular, or inguinal nodes palpable. CHEST:  No chest wall tenderness or crepitus.  Breath sounds are clear and equal bilaterally. CARDIAC:  Regular rate and rhythm.  No murmurs.  Peripheral pulses intact.  No JVD. PELVIS:  Stable and nontender. ABDOMEN:  Well-healed low midline incision, soft, nontender.  No masses or organomegaly. EXTREMITIES:  There is tenderness, bruising, and swelling over the right clavicle.  The left lower extremity has  1+ swelling and bruising that appears old.  There are healing incisions without infection following ORIF.  No deformity of any extremities.  There is full active and passive range of motion except for left hip and right shoulder. NEUROLOGIC:  The patient is alert and conversant and following commands. She knows her name and her birth date, but is not oriented to place, current time, or situation.  She has good strength in all 4 extremities and normal sensation.  Pupils are equal, round, and reactive.  LABORATORY DATA:  White count is 8.8 and hemoglobin is 9.  Electrolytes are pending.  Urinalysis is pending.  IMAGING:  CT scan of the head shows a left frontoparietal subdural fluid collection with mixed density consistent with subacute subdural hematoma.  There  is minimal 4-mm shift.  CT of the neck shows some degenerative changes, but no acute fracture.  Right clavicle x-ray shows a displaced midshaft clavicle fracture.  Pelvis shows ORIF of left hip with no displacement and no other acute injury.  ASSESSMENT/PLAN:  This is an 77 year old female with a fall at the nursing home.  She has a small subdural hematoma that actually appears subacute and may have occurred at the time of her fall and hip fracture. She is on anticoagulation for DVT prophylaxis post-hip fracture.  This is being reversed with FFP and vitamin K.  She appears neurologically at baseline.  Neurosurgery is being consulted.  She also has a midshaft clavicle fracture markedly displaced.  We will ask Orthopedics to see her for this.     Lorne Skeens. Kaito Schulenburg, M.D.     Tory Emerald  D:  03/25/2011  T:  03/25/2011  Job:  [id number]  Electronically Signed by Glenna Fellows M.D. on 03/27/2011 11:52:08 AM

## 2011-03-28 LAB — CROSSMATCH
ABO/RH(D): O POS
Antibody Screen: POSITIVE
DAT, IgG: NEGATIVE
Donor AG Type: NEGATIVE
Donor AG Type: NEGATIVE
Unit division: 0

## 2011-03-28 LAB — TYPE AND SCREEN: ABO/RH(D): O POS

## 2011-04-04 ENCOUNTER — Emergency Department (HOSPITAL_COMMUNITY): Admission: EM | Admit: 2011-04-04 | Payer: Medicare Other | Source: Home / Self Care

## 2011-04-04 ENCOUNTER — Emergency Department (HOSPITAL_COMMUNITY)
Admission: EM | Admit: 2011-04-04 | Discharge: 2011-04-05 | Disposition: A | Discharge status: Home / Self Care | Payer: Medicare Other | Attending: Emergency Medicine | Admitting: Emergency Medicine

## 2011-04-04 ENCOUNTER — Emergency Department (HOSPITAL_COMMUNITY): Payer: Medicare Other

## 2011-04-04 DIAGNOSIS — S0990XA Unspecified injury of head, initial encounter: Secondary | ICD-10-CM | POA: Insufficient documentation

## 2011-04-04 DIAGNOSIS — S0083XA Contusion of other part of head, initial encounter: Secondary | ICD-10-CM | POA: Insufficient documentation

## 2011-04-04 DIAGNOSIS — F0390 Unspecified dementia without behavioral disturbance: Secondary | ICD-10-CM | POA: Insufficient documentation

## 2011-04-04 DIAGNOSIS — F411 Generalized anxiety disorder: Secondary | ICD-10-CM | POA: Insufficient documentation

## 2011-04-04 DIAGNOSIS — Z7982 Long term (current) use of aspirin: Secondary | ICD-10-CM | POA: Insufficient documentation

## 2011-04-04 DIAGNOSIS — N390 Urinary tract infection, site not specified: Secondary | ICD-10-CM | POA: Insufficient documentation

## 2011-04-04 DIAGNOSIS — Y9210 Unspecified residential institution as the place of occurrence of the external cause: Secondary | ICD-10-CM | POA: Insufficient documentation

## 2011-04-04 DIAGNOSIS — S0003XA Contusion of scalp, initial encounter: Secondary | ICD-10-CM | POA: Insufficient documentation

## 2011-04-04 DIAGNOSIS — W010XXA Fall on same level from slipping, tripping and stumbling without subsequent striking against object, initial encounter: Secondary | ICD-10-CM | POA: Insufficient documentation

## 2011-04-04 HISTORY — DX: Traumatic subdural hemorrhage with loss of consciousness of unspecified duration, initial encounter: S06.5X9A

## 2011-04-04 HISTORY — DX: Traumatic subdural hemorrhage with loss of consciousness status unknown, initial encounter: S06.5XAA

## 2011-04-04 LAB — POCT I-STAT, CHEM 8
Chloride: 100 mEq/L (ref 96–112)
Creatinine, Ser: 0.9 mg/dL (ref 0.4–1.2)
HCT: 33 % — ABNORMAL LOW (ref 36.0–46.0)
Hemoglobin: 11.2 g/dL — ABNORMAL LOW (ref 12.0–15.0)
Potassium: 3.3 mEq/L — ABNORMAL LOW (ref 3.5–5.1)
Sodium: 139 mEq/L (ref 135–145)

## 2011-04-04 LAB — URINALYSIS, ROUTINE W REFLEX MICROSCOPIC
Nitrite: POSITIVE — AB
Specific Gravity, Urine: 1.026 (ref 1.005–1.030)
Urobilinogen, UA: 1 mg/dL (ref 0.0–1.0)

## 2011-04-04 LAB — URINE MICROSCOPIC-ADD ON

## 2011-04-05 ENCOUNTER — Encounter (HOSPITAL_COMMUNITY): Payer: Self-pay | Admitting: Radiology

## 2011-04-06 LAB — URINE CULTURE

## 2011-04-12 ENCOUNTER — Ambulatory Visit (HOSPITAL_COMMUNITY)
Admission: RE | Admit: 2011-04-12 | Discharge: 2011-04-12 | Disposition: A | Discharge status: Home / Self Care | Payer: Medicare Other | Source: Ambulatory Visit | Attending: Internal Medicine | Admitting: Internal Medicine

## 2011-04-12 DIAGNOSIS — Z1389 Encounter for screening for other disorder: Secondary | ICD-10-CM | POA: Insufficient documentation

## 2011-04-12 DIAGNOSIS — R55 Syncope and collapse: Secondary | ICD-10-CM | POA: Insufficient documentation

## 2011-04-12 NOTE — Discharge Summary (Signed)
[name], [name]              ACCOUNT NO.:  [id number]  MEDICAL RECORD NO.:  [id number]           PATIENT TYPE:  I  LOCATION:  3029                         FACILITY:  [facility]  PHYSICIAN:  Gabrielle Dare. Janee Morn, M.D.DATE OF BIRTH:  1924-02-04  DATE OF ADMISSION:  03/25/2011 DATE OF DISCHARGE:                              DISCHARGE SUMMARY   DATE OF ADMISSION:  March 25, 2011  DATE OF DISCHARGE:  March 27, 2011  DISPOSITION:  The patient to be discharged back to skilled nursing.  ADMITTING TRAUMA SURGEON:  Sharlet Salina T. Hoxworth, MD  CONSULTANTS:  Heide Guile, MD, Neurosurgery  DISCHARGE DIAGNOSES: 1. Fall from level ground. 2. Traumatic brain injury with acute on chronic subdural hematoma. 3. Recent open reduction and internal fixation left hip fracture on     March 10, 2011. 4. Coumadin prophylaxis after the hip fracture.  This has been     discontinued. 5. Acute blood loss anemia. 6. Right clavicle fracture treated conservatively with a sling.  PAST MEDICAL HISTORY: 1. Chronic Alzheimer dementia. 2. Seizure disorder.  PROCEDURES:  None.  HISTORY:  This is a pleasantly confused 77 year old female who was at a skilled nursing facility when she fell.  She had had a recent left hip fracture and underwent open reduction and internal fixation by Dr. Venita Lick.  She was placed on Coumadin for DVT/PE prophylaxis following her hip fracture.  Workup in the ED including the CT scan of the head showed a left frontoparietal subdural fluid collection of mixed density consistent with an acute on chronic subdural hematoma.  There was minimal shift.  CT scan of the C-spine showed degenerative changes that was negative.  Right clavicle x-ray showed a mid shaft fracture. Her left hip showed her open reduction and internal fixation to be intact with no evidence for displacement.  The patient was admitted.  She was seen in consultation by Dr. Sylvie Farrier for observation who agreed with  discontinuing all anticoagulants and reversal of her Coumadin with fresh frozen plasma and vitamin K.  There was no other intervention recommended and it was not recommended that she would be re-CT'd unless she had a decline in her status.  The patient has continued to do well.  She remains pleasantly confused, but alert, appropriate, and without any focal neurologic deficits.  With regard to her recent left hip fracture, Dr. Shon Baton has recommended that her staples be discontinued today and she will need to follow up in his office next week.  She should remain off of all anticoagulants.  MEDICATIONS:  At time of discharge include, 1. Protonix 40 mg p.o. daily. 2. Aricept 5 mg p.o. at bedtime. 3. Ferrous sulfate 325 mg p.o. t.i.d. with meals. 4. Colace 100 mg p.o. b.i.d. 5. Ativan 1 mg p.o. b.i.d. 6. Ultram 50 mg one-half p.o. t.i.d. p.r.n. pain.  DIET:  Her diet is regular as tolerated.  Again, she needs to follow up with Dr. Venita Lick in next week. Arrangements will need to be made for this appointment.  She can follow up with Trauma Service and Dr. Sylvie Farrier on an as-needed basis.  She will likely be  discharged to skilled nursing later today.     Shawn Rayburn, P.A.   ______________________________ Gabrielle Dare Janee Morn, M.D.    SR/MEDQ  D:  03/27/2011  T:  03/27/2011  Job:  [id number]  Electronically Signed by Lazaro Arms P.A. on 04/03/2011 01:27:08 PM Electronically Signed by Violeta Gelinas M.D. on 04/12/2011 04:14:11 PM

## 2011-04-13 NOTE — Procedures (Signed)
EEG NUMBER:  HISTORY:  An 77 year old female with syncope.  MEDICATIONS:  Coumadin, Diflucan, Ativan, iron sulfate, tramadol, zolpidem, Aricept, aspirin.  CONDITIONS OF RECORDING:  This is a 16-channel EEG carried out with the patient in the awake and drowsy states.  DESCRIPTION:  The waking background activity consists of a low-voltage symmetrical, fairly well-organized 8-9 Hz alpha activity seen from the parieto-occipital and posterotemporal regions.  Low-voltage fast activity poorly organized and seen anteriorly and at times, superimposed on more posterior rhythms.  A mixture of theta and alpha rhythms seen from the central and temporal regions.  The patient drowses with slowing to irregular low-voltage theta and beta activity.  Stage II sleep was not obtained.  Hypoventilation was not performed.  Intermittent photic stimulation failed to elicit any abnormalities.  IMPRESSION:  This is a normal EEG.          ______________________________ Thana Farr, MD    [id number] D:  04/12/2011 17:22:46  T:  04/13/2011 01:20:38  Job #:  [id number]

## 2012-10-16 IMAGING — CR DG CHEST 1V
1 series · 1 of 1 positions shown · non-contrast
Comparison: 02/04/2011

CLINICAL DATA: Left hip fracture.  Preoperative respiratory
examination.

CHEST - 1 VIEW

[t chest supine]
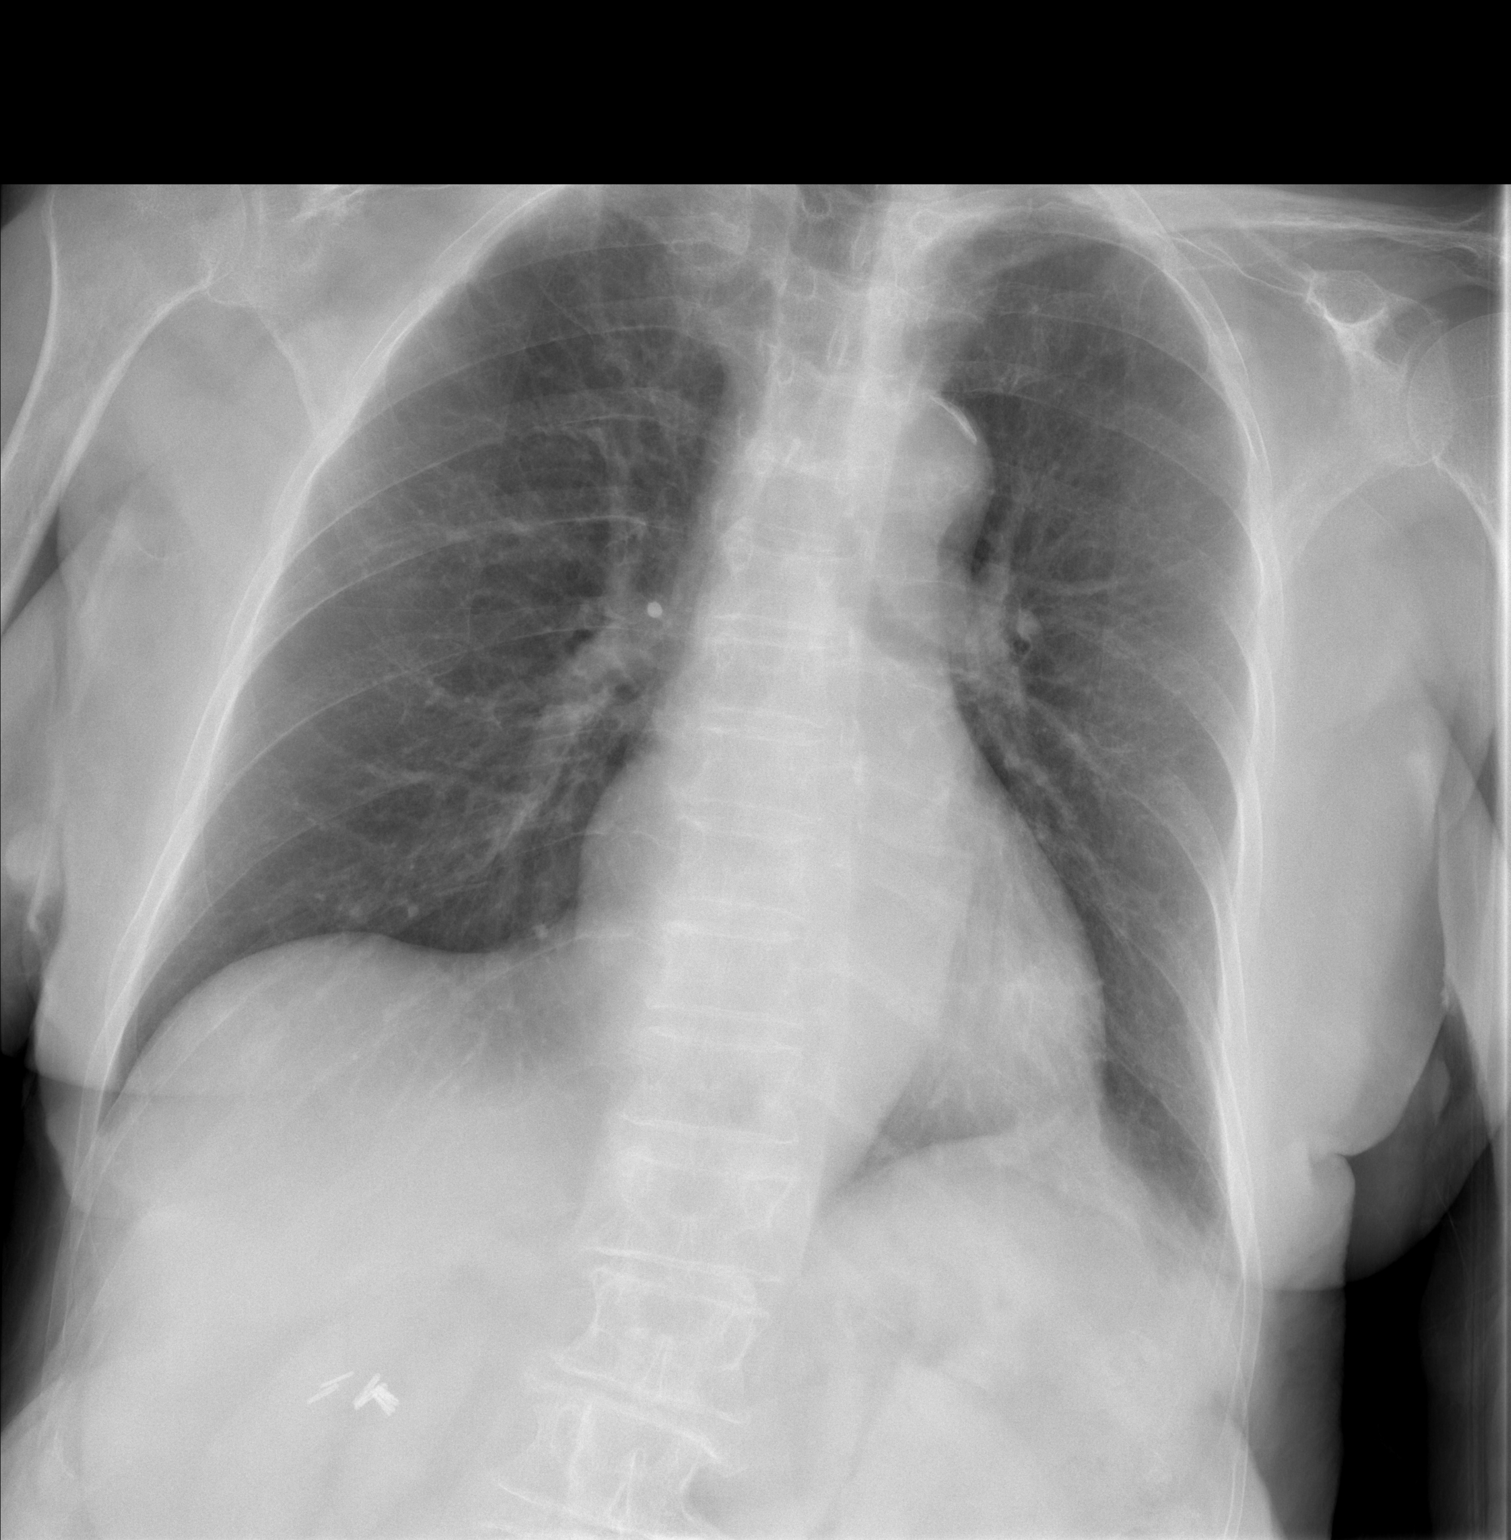

[1 of 1 positions shown; findings below may reference images not displayed]

FINDINGS: The heart size is upper limits of normal.
Interstitial prominence is again identified.
There is no evidence of focal airspace disease, pulmonary edema,
pulmonary nodule/mass, pleural effusion, or pneumothorax.
No acute bony abnormalities are identified.
IMPRESSION: No evidence of acute cardiopulmonary disease.

Upper limits normal heart size and chronic interstitial prominence.

## 2012-10-16 IMAGING — CR DG FEMUR 2+V PORT*L*
1 series · 5 of 5 positions shown · non-contrast
Comparison: 03/09/2009, time [DATE] p.m. and 3152

CLINICAL DATA: Postoperative evaluation, left femur

PORTABLE LEFT FEMUR - 2 VIEW

[Series 1: AP · left · 5 of 5 slices shown]
[im 1/5]
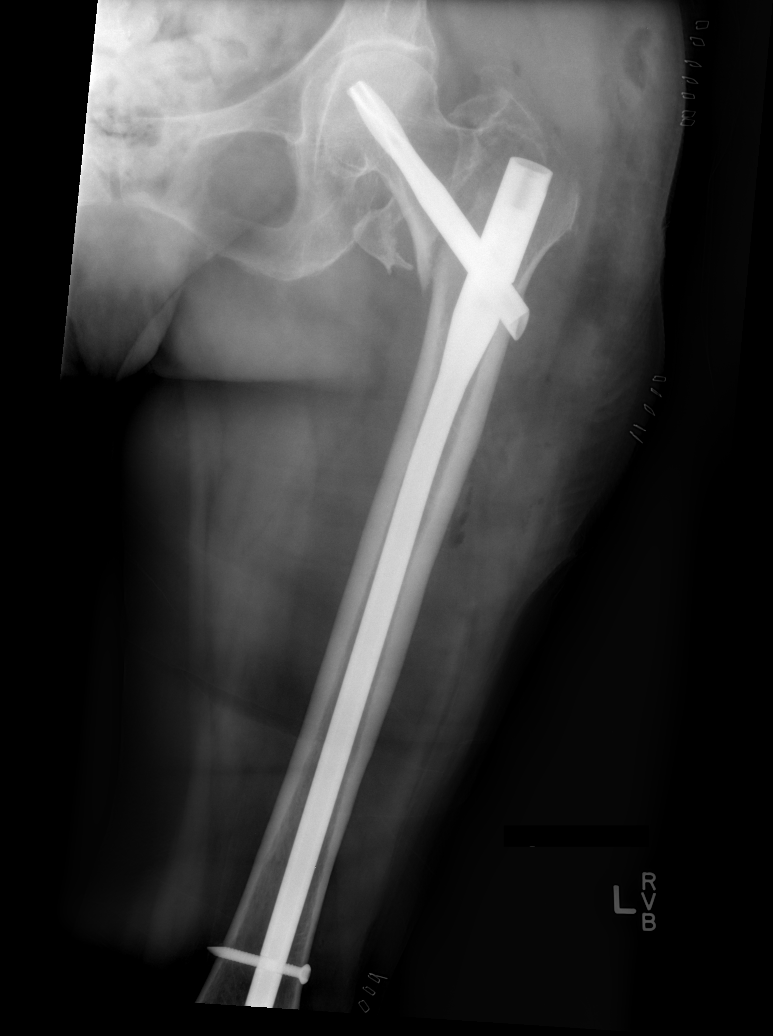
[im 2/5]
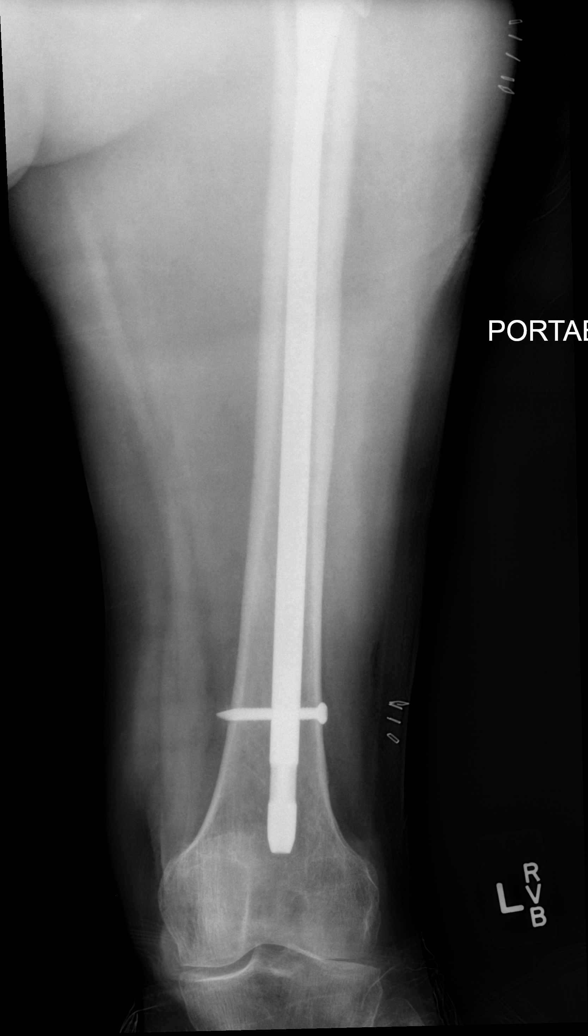
[im 3/5]
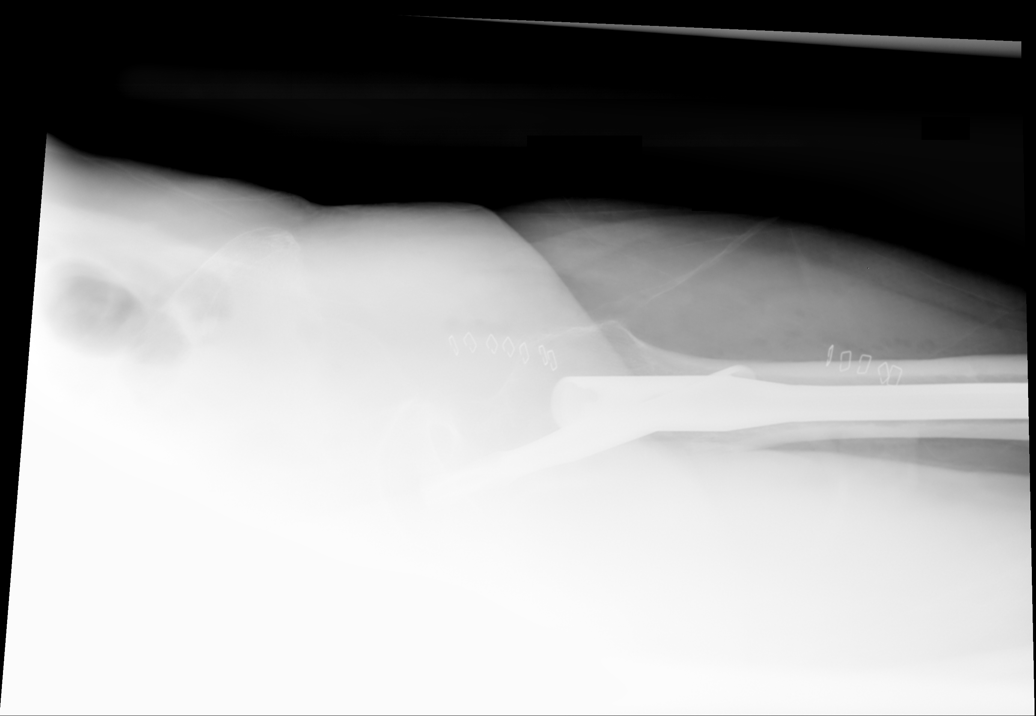
[im 4/5]
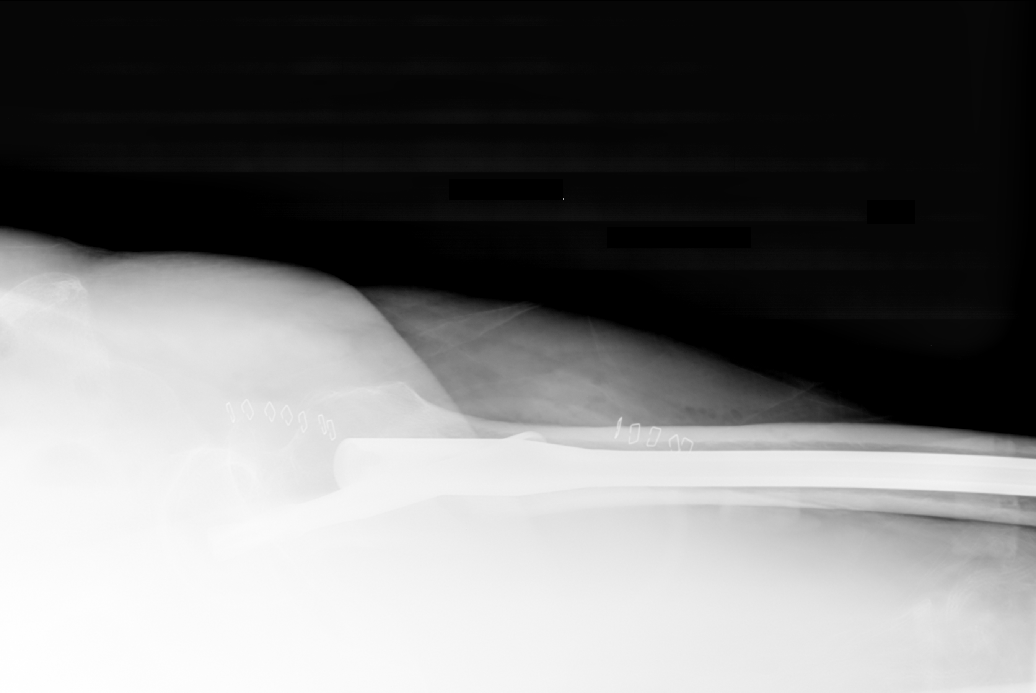
[im 5/5]
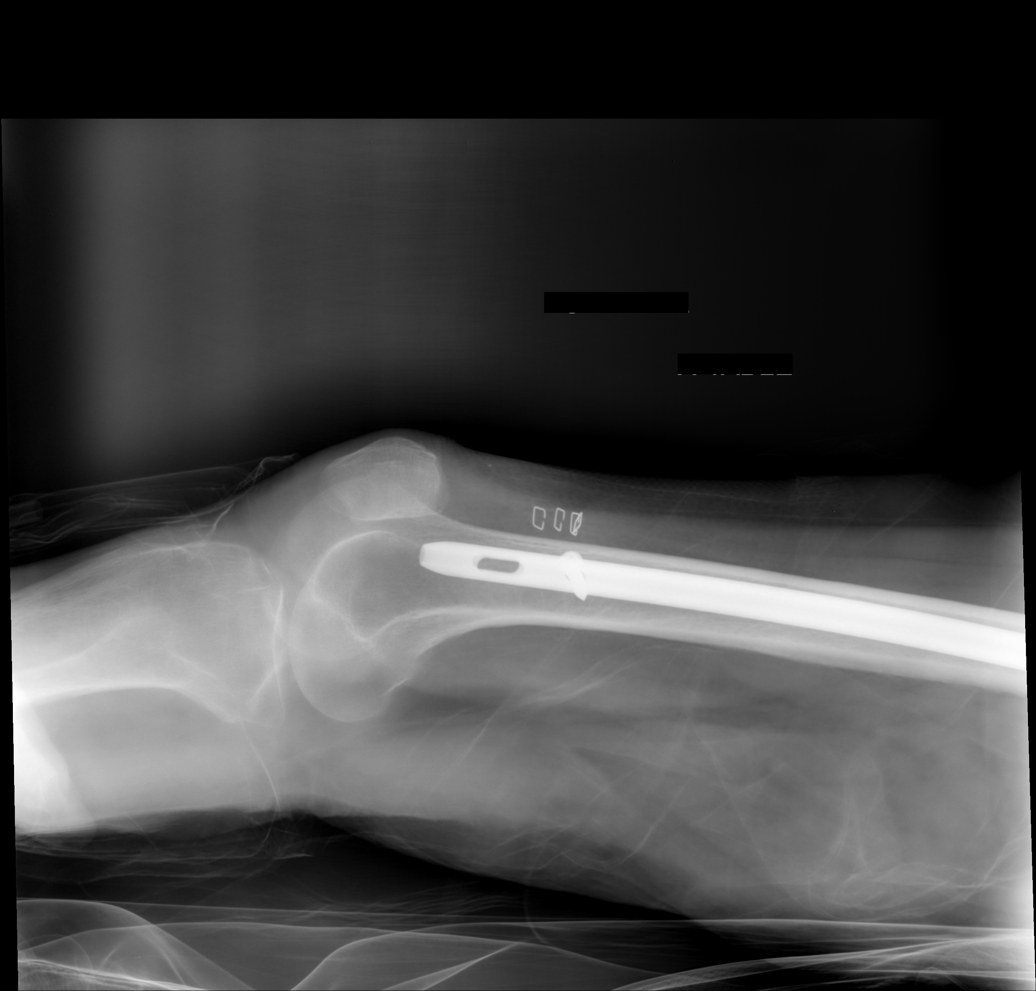

[5 of 5 positions shown; findings below may reference images not displayed]

FINDINGS: Interval open reduction/internal fixation of the
intertrochanteric left femur fracture with improved alignment. The
lesser trochanter remains displaced.  A long femoral stem is
present with a single distal interlocking screw.  No peri prostatic
fracture or immediate hardware complication is noted.
Postoperative changes are seen in the soft tissues.
IMPRESSION: ORIF of the intertrochanteric left femur fracture.

## 2012-10-16 IMAGING — RF DG FEMUR 2V*L*
1 series · 6 of 6 positions shown · non-contrast
Comparison: Film earlier this date

CLINICAL DATA: Left hip fracture - ORIF.

LEFT FEMUR - 2 VIEW

[Series 1: run · 6 of 6 slices shown]
[im 1/6]
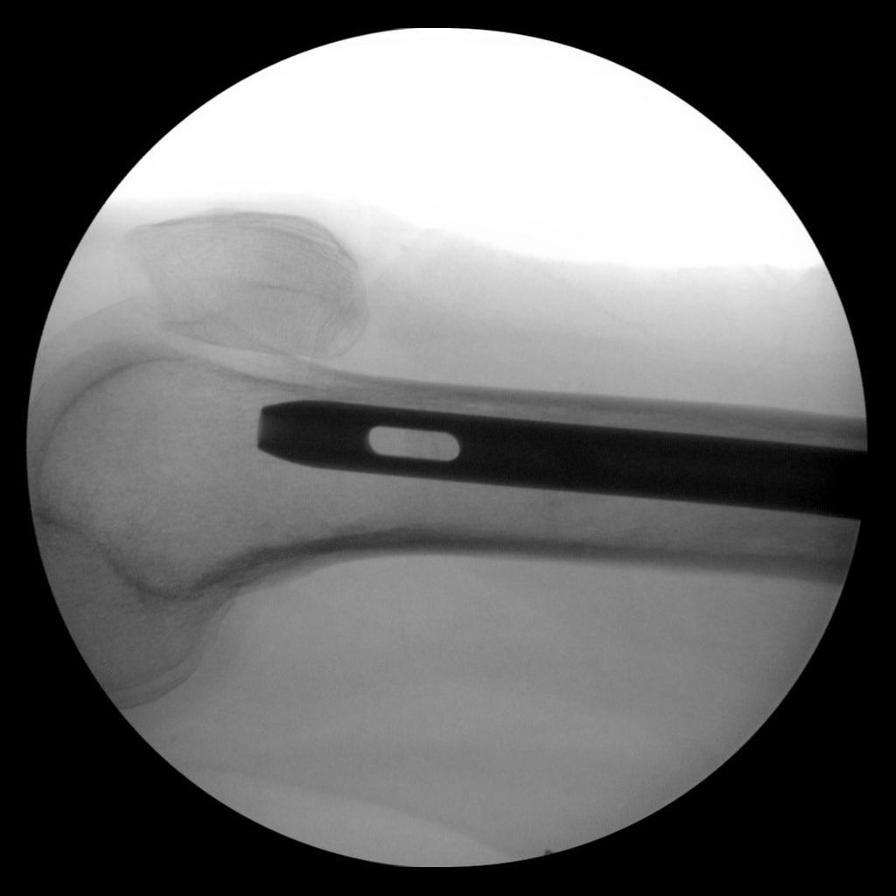
[im 2/6]
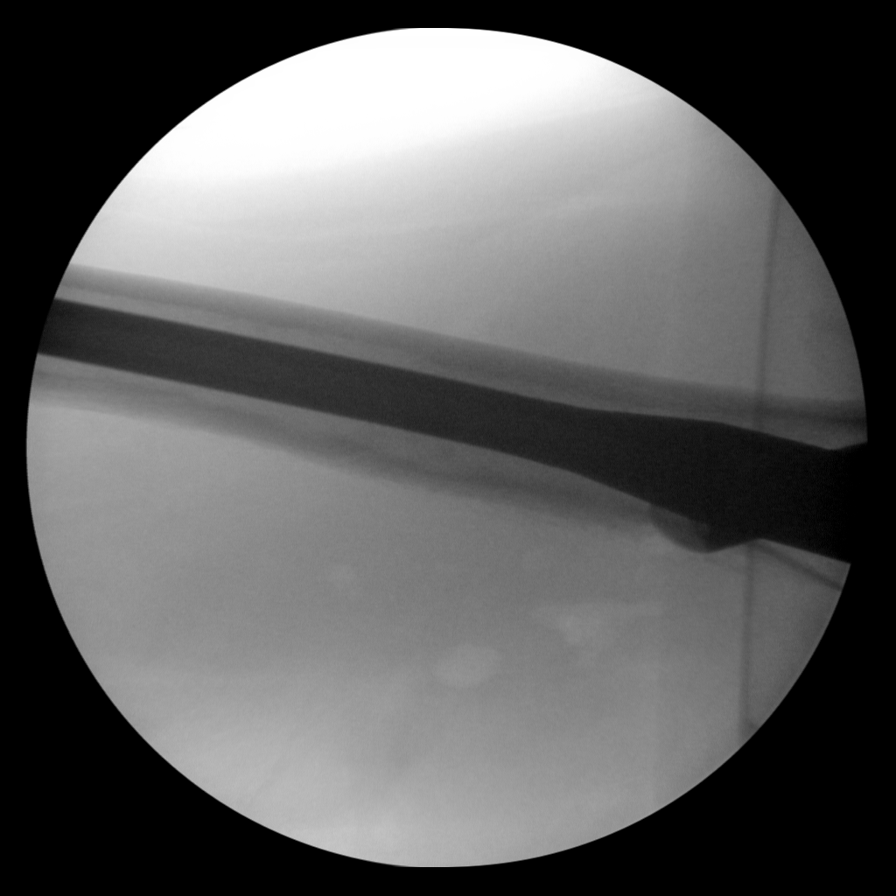
[im 3/6]
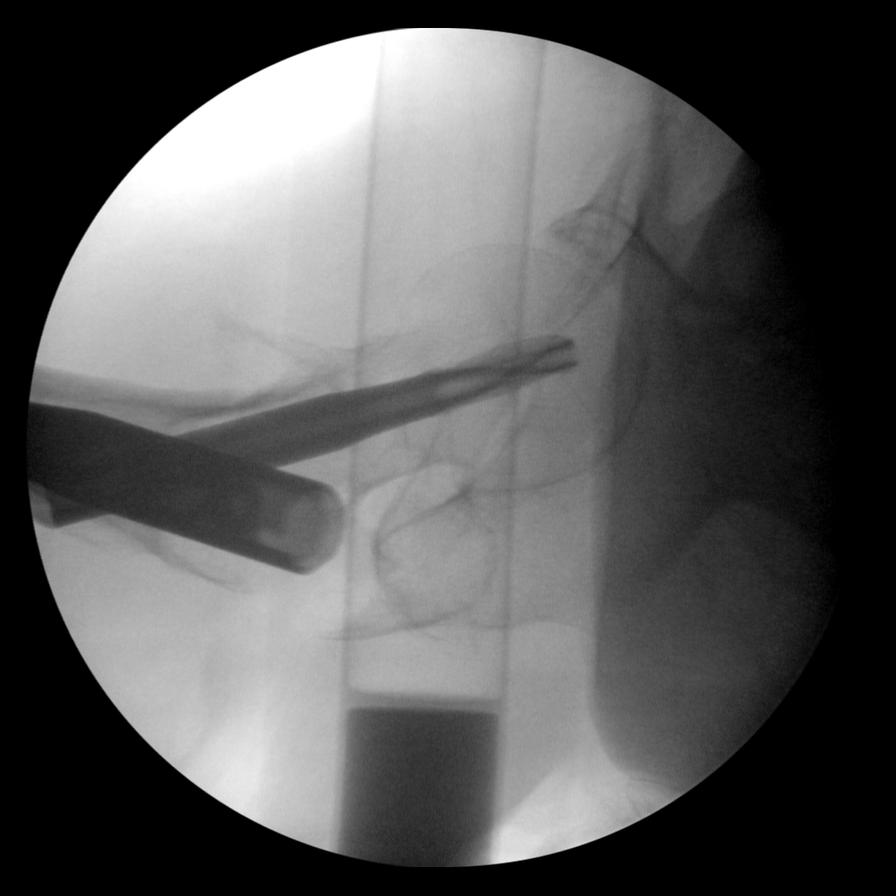
[im 4/6]
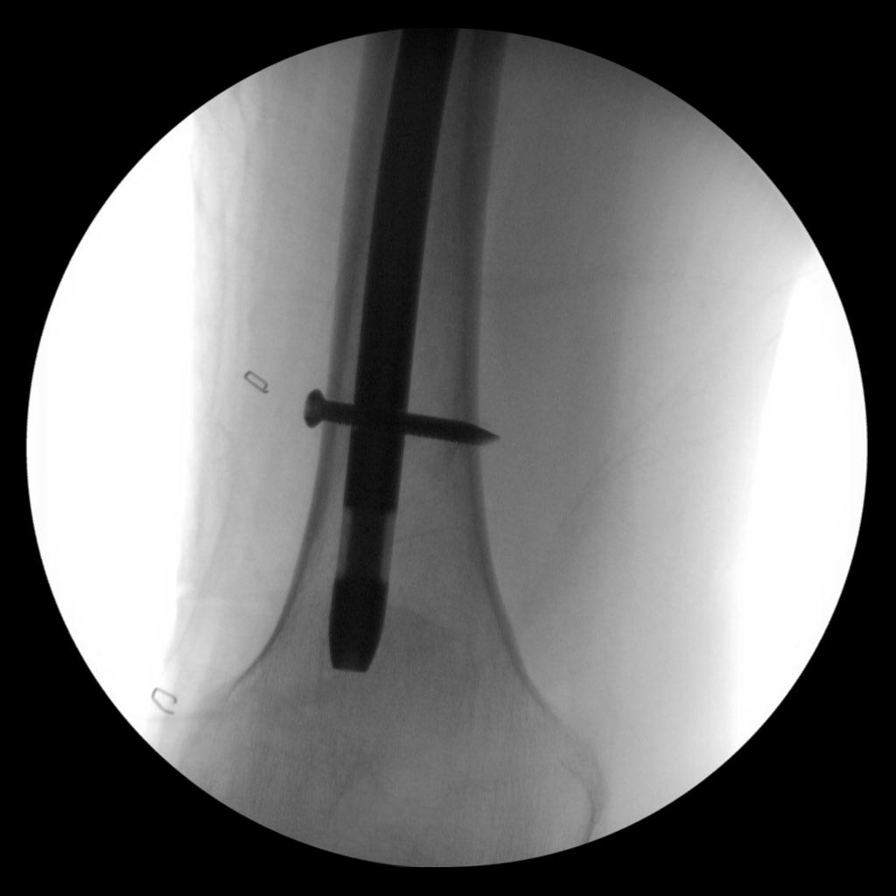
[im 5/6]
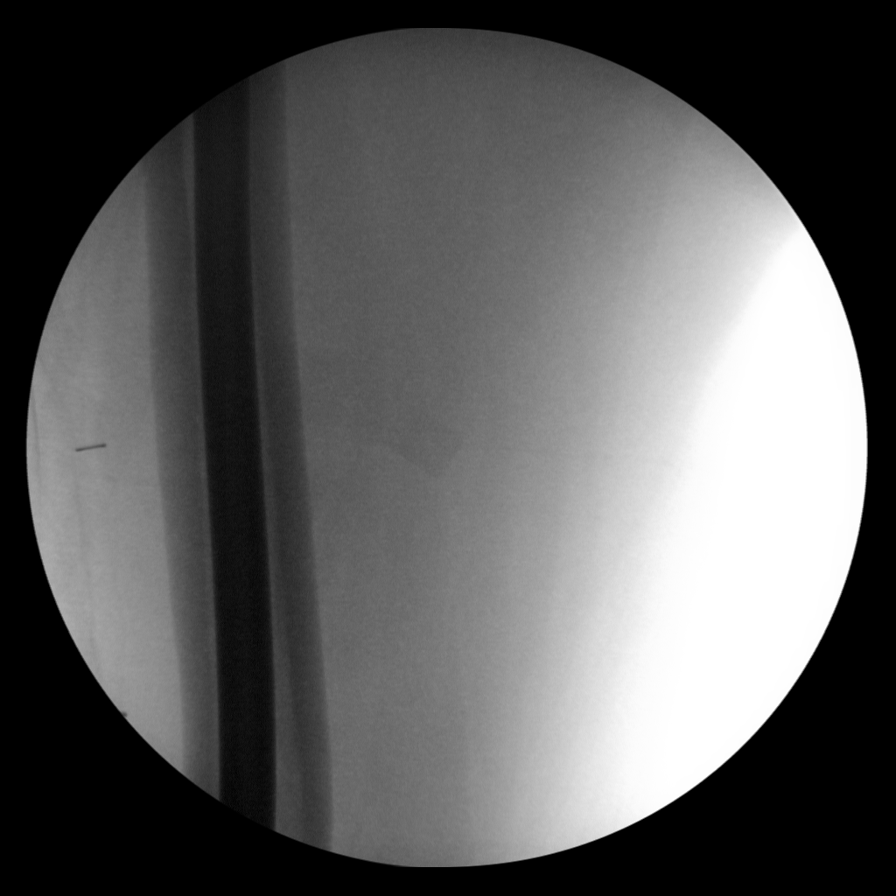
[im 6/6]
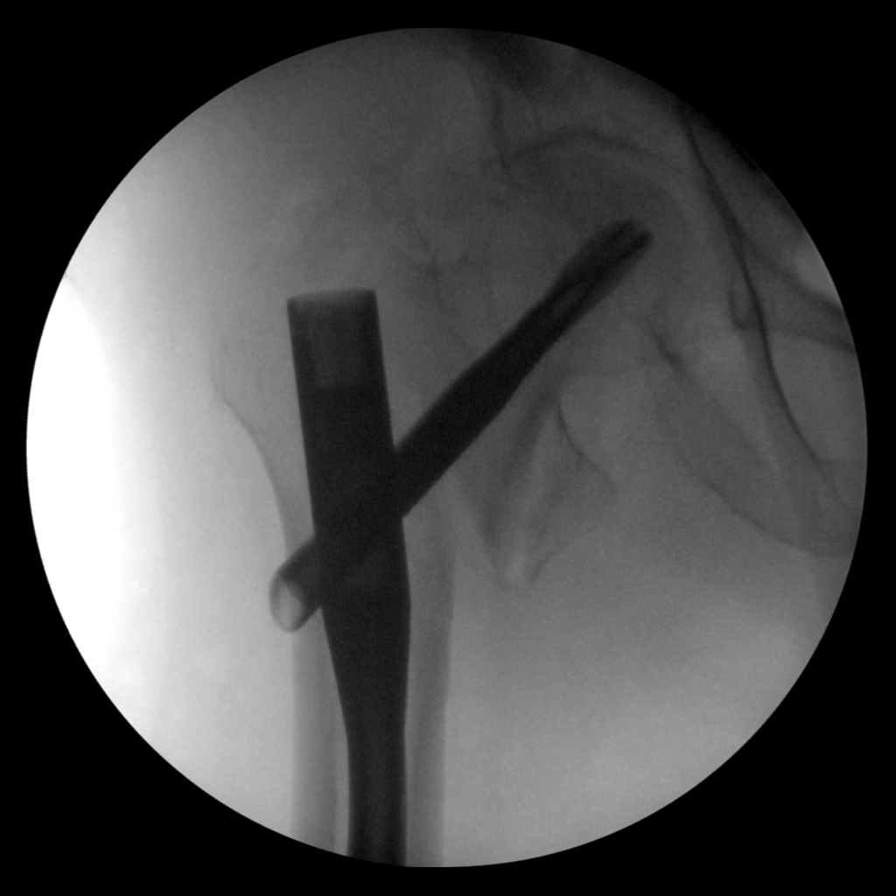

[6 of 6 positions shown; findings below may reference images not displayed]

FINDINGS: Numerous intraoperative spot films of the left femur are
submitted postoperatively for interpretation.

Interval placement of an intramedullary rod/nail is noted
traversing a left femoral intertrochanteric fracture.
No gross complicating features are identified.
IMPRESSION: ORIF left intertrochanteric fracture.

## 2013-06-20 ENCOUNTER — Encounter (HOSPITAL_COMMUNITY): Payer: Self-pay

## 2013-06-20 ENCOUNTER — Emergency Department (HOSPITAL_COMMUNITY)
Admission: EM | Admit: 2013-06-20 | Discharge: 2013-06-21 | Disposition: A | Discharge status: Skilled Nursing Facility | Payer: Medicare Other | Attending: Emergency Medicine | Admitting: Emergency Medicine

## 2013-06-20 DIAGNOSIS — Z79899 Other long term (current) drug therapy: Secondary | ICD-10-CM | POA: Insufficient documentation

## 2013-06-20 DIAGNOSIS — F0280 Dementia in other diseases classified elsewhere without behavioral disturbance: Secondary | ICD-10-CM | POA: Insufficient documentation

## 2013-06-20 DIAGNOSIS — N189 Chronic kidney disease, unspecified: Secondary | ICD-10-CM | POA: Insufficient documentation

## 2013-06-20 DIAGNOSIS — M810 Age-related osteoporosis without current pathological fracture: Secondary | ICD-10-CM | POA: Insufficient documentation

## 2013-06-20 DIAGNOSIS — F411 Generalized anxiety disorder: Secondary | ICD-10-CM | POA: Insufficient documentation

## 2013-06-20 DIAGNOSIS — Z09 Encounter for follow-up examination after completed treatment for conditions other than malignant neoplasm: Secondary | ICD-10-CM

## 2013-06-20 DIAGNOSIS — Z0189 Encounter for other specified special examinations: Secondary | ICD-10-CM | POA: Insufficient documentation

## 2013-06-20 DIAGNOSIS — Z7982 Long term (current) use of aspirin: Secondary | ICD-10-CM | POA: Insufficient documentation

## 2013-06-20 DIAGNOSIS — Z8679 Personal history of other diseases of the circulatory system: Secondary | ICD-10-CM | POA: Insufficient documentation

## 2013-06-20 DIAGNOSIS — G309 Alzheimer's disease, unspecified: Secondary | ICD-10-CM | POA: Insufficient documentation

## 2013-06-20 HISTORY — DX: Dementia in other diseases classified elsewhere, unspecified severity, without behavioral disturbance, psychotic disturbance, mood disturbance, and anxiety: F02.80

## 2013-06-20 HISTORY — DX: Alzheimer's disease, unspecified: G30.9

## 2013-06-20 HISTORY — DX: Chronic kidney disease, unspecified: N18.9

## 2013-06-20 HISTORY — DX: Age-related osteoporosis without current pathological fracture: M81.0

## 2013-06-20 HISTORY — DX: Anxiety disorder, unspecified: F41.9

## 2013-06-20 LAB — URINALYSIS, ROUTINE W REFLEX MICROSCOPIC
Leukocytes, UA: NEGATIVE
Nitrite: NEGATIVE
Protein, ur: NEGATIVE mg/dL
Specific Gravity, Urine: 1.009 (ref 1.005–1.030)
Urobilinogen, UA: 0.2 mg/dL (ref 0.0–1.0)

## 2013-06-20 LAB — URINE MICROSCOPIC-ADD ON

## 2013-06-20 NOTE — ED Provider Notes (Signed)
History     CSN: [id number]  Arrival date & time 06/20/13  2113   First MD Initiated Contact with Patient 06/20/13 2120      Chief Complaint  Patient presents with  . Follow-up    Here to provide a urine sample b/c pt wasn't able to provide one 2 days ago     (Consider location/radiation/quality/duration/timing/severity/associated sxs/prior treatment) HPI  Patient is an 79 yo F PMHx significant for dementia is presenting for a follow up from [facility] with the family requesting a urine sample as patient was not able to provide a urine two days ago when she was initially evaluated for a fall at a non-[facility] ED. According to the Nursing Home staff the family is only requesting a urinalysis without further work up. Patient has no complaints at this time, but d/t to her dementia is a Level V Caveat.   Past Medical History  Diagnosis Date  . Subdural hematoma   . Alzheimer disease   . Osteoporosis   . Anxiety   . Chronic kidney disease     History reviewed. No pertinent past surgical history.  No family history on file.  History  Substance Use Topics  . Smoking status: Never Smoker   . Smokeless tobacco: Not on file  . Alcohol Use: No    OB History   Grav Para Term Preterm Abortions TAB SAB Ect Mult Living                  Review of Systems  Unable to perform ROS: Dementia    Allergies  Review of patient's allergies indicates no known allergies.  Home Medications   Current Outpatient Rx  Name  Route  Sig  Dispense  Refill  . acetaminophen (TYLENOL) 325 MG tablet   Oral   Take 650 mg by mouth every 6 (six) hours as needed for pain (pain/fever). Currently on hold per [facility]         . acetaminophen (TYLENOL) 500 MG tablet   Oral   Take 500 mg by mouth 2 (two) times daily.         Marland Kitchen aspirin 81 MG chewable tablet   Oral   Chew 81 mg by mouth daily.         Marland Kitchen donepezil (ARICEPT) 10 MG tablet   Oral   Take 10 mg by mouth at bedtime as  needed.         Marland Kitchen ibuprofen (ADVIL,MOTRIN) 400 MG tablet   Oral   Take 400 mg by mouth 3 (three) times daily. Take at 8am,2pm,8pm   For 5 days end on 06/26/13         . LORazepam (ATIVAN) 0.5 MG tablet   Oral   Take 0.25 mg by mouth 3 (three) times daily as needed for anxiety (anxiety). Take one-half tab to =0.25mg          . omeprazole (PRILOSEC) 20 MG capsule   Oral   Take 20 mg by mouth daily.           BP 119/93  Pulse 71  Temp(Src) 98.2 F (36.8 C) (Oral)  Resp 20  SpO2 97%  Physical Exam  Constitutional: She appears well-developed and well-nourished. No distress.  HENT:  Head: Normocephalic and atraumatic.  Eyes: Conjunctivae and EOM are normal. Pupils are equal, round, and reactive to light.  Neck: Neck supple.  Cardiovascular: Normal rate, regular rhythm, normal heart sounds and intact distal pulses.   Pulmonary/Chest: Effort normal  and breath sounds normal.  Abdominal: Soft. Bowel sounds are normal. There is no tenderness.  Neurological: She is alert. She has normal strength. No cranial nerve deficit or sensory deficit.  Alert and oriented to self and place  Skin: Skin is warm and dry. She is not diaphoretic.  Psychiatric: She has a normal mood and affect.    ED Course  Procedures (including critical care time)  Labs Reviewed  URINALYSIS, ROUTINE W REFLEX MICROSCOPIC - Abnormal; Notable for the following:    Hgb urine dipstick TRACE (*)    All other components within normal limits  URINE MICROSCOPIC-ADD ON   No results found.   1. Follow up       MDM  Patient was sent over from Nursing Home at request of family to obtain a urinalysis for concern for UTI. I attempted to contact the son who is the healthcare proxy multiple time without success. Upon talking to one of the nurses at the nursing home they stated that the family was only requesting a urine sample as previous workup 2 days ago for the fall was benign. The patient did not have any  concerning findings on physical examination. No neurofocal deficits. No other concerning findings to pursue further workup despite request only for urinalysis. Patient d/w with Dr. Juleen China, agrees with plan. Patient is stable at time of discharge          Jeannetta Ellis, PA-C 06/21/13 1601

## 2013-06-20 NOTE — ED Notes (Signed)
Bed:WHALA<BR> Expected date:<BR> Expected time:<BR> Means of arrival:<BR> Comments:<BR>

## 2013-06-20 NOTE — ED Notes (Signed)
Ptar pending

## 2013-06-20 NOTE — ED Notes (Signed)
Pt has alzheimer from [facility] nursing home. Larey Seat 2 days ago and went to the ED for check up, multiple xrays were done and normal, pt were also asked to provide an urine sample but unable to do so. Pt was discharged back to nursing home eventually. Now, 2 days later, pt is here at [facility] because she (and her family member) feel that she needs to follow up with the urine sample. Pt is basically here to provide [place] an urine sample. ABC intact. Pt sts she feels comfortable, just feels "stiff". sts "I don't really know why I am here"

## 2013-06-21 NOTE — ED Notes (Signed)
Pt will be transporting back to [place] nursing home per [facility]

## 2013-06-27 NOTE — ED Provider Notes (Signed)
Medical screening examination/treatment/procedure(s) were performed by non-physician practitioner and as supervising physician I was immediately available for consultation/collaboration.  Mckinze Poirier, MD 06/27/13 1643 

## 2013-11-06 ENCOUNTER — Encounter (HOSPITAL_COMMUNITY): Payer: Self-pay | Admitting: Emergency Medicine

## 2013-11-06 ENCOUNTER — Emergency Department (HOSPITAL_COMMUNITY)
Admission: EM | Admit: 2013-11-06 | Discharge: 2013-11-06 | Disposition: A | Discharge status: Home / Self Care | Payer: Medicare Other | Attending: Emergency Medicine | Admitting: Emergency Medicine

## 2013-11-06 ENCOUNTER — Emergency Department (HOSPITAL_COMMUNITY): Payer: Medicare Other

## 2013-11-06 DIAGNOSIS — Z8739 Personal history of other diseases of the musculoskeletal system and connective tissue: Secondary | ICD-10-CM | POA: Insufficient documentation

## 2013-11-06 DIAGNOSIS — Z7982 Long term (current) use of aspirin: Secondary | ICD-10-CM | POA: Insufficient documentation

## 2013-11-06 DIAGNOSIS — Y9289 Other specified places as the place of occurrence of the external cause: Secondary | ICD-10-CM | POA: Insufficient documentation

## 2013-11-06 DIAGNOSIS — N189 Chronic kidney disease, unspecified: Secondary | ICD-10-CM | POA: Insufficient documentation

## 2013-11-06 DIAGNOSIS — Y939 Activity, unspecified: Secondary | ICD-10-CM | POA: Insufficient documentation

## 2013-11-06 DIAGNOSIS — Z79899 Other long term (current) drug therapy: Secondary | ICD-10-CM | POA: Insufficient documentation

## 2013-11-06 DIAGNOSIS — F0280 Dementia in other diseases classified elsewhere without behavioral disturbance: Secondary | ICD-10-CM | POA: Insufficient documentation

## 2013-11-06 DIAGNOSIS — G309 Alzheimer's disease, unspecified: Secondary | ICD-10-CM | POA: Insufficient documentation

## 2013-11-06 DIAGNOSIS — Z8679 Personal history of other diseases of the circulatory system: Secondary | ICD-10-CM | POA: Insufficient documentation

## 2013-11-06 DIAGNOSIS — S0003XA Contusion of scalp, initial encounter: Secondary | ICD-10-CM | POA: Insufficient documentation

## 2013-11-06 DIAGNOSIS — W1809XA Striking against other object with subsequent fall, initial encounter: Secondary | ICD-10-CM | POA: Insufficient documentation

## 2013-11-06 DIAGNOSIS — F411 Generalized anxiety disorder: Secondary | ICD-10-CM | POA: Insufficient documentation

## 2013-11-06 NOTE — ED Notes (Signed)
Alida at facility made aware that patient is coming back to facility.
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
## 2015-02-27 ENCOUNTER — Emergency Department (HOSPITAL_COMMUNITY): Payer: Medicare Other

## 2015-02-27 ENCOUNTER — Encounter (HOSPITAL_COMMUNITY): Payer: Self-pay | Admitting: Emergency Medicine

## 2015-02-27 ENCOUNTER — Emergency Department (HOSPITAL_COMMUNITY)
Admission: EM | Admit: 2015-02-27 | Discharge: 2015-02-27 | Disposition: A | Discharge status: Home / Self Care | Payer: Medicare Other | Attending: Emergency Medicine | Admitting: Emergency Medicine

## 2015-02-27 DIAGNOSIS — Z7982 Long term (current) use of aspirin: Secondary | ICD-10-CM | POA: Diagnosis not present

## 2015-02-27 DIAGNOSIS — Z8739 Personal history of other diseases of the musculoskeletal system and connective tissue: Secondary | ICD-10-CM | POA: Insufficient documentation

## 2015-02-27 DIAGNOSIS — W050XXA Fall from non-moving wheelchair, initial encounter: Secondary | ICD-10-CM | POA: Insufficient documentation

## 2015-02-27 DIAGNOSIS — Y9389 Activity, other specified: Secondary | ICD-10-CM | POA: Diagnosis not present

## 2015-02-27 DIAGNOSIS — Y92128 Other place in nursing home as the place of occurrence of the external cause: Secondary | ICD-10-CM | POA: Insufficient documentation

## 2015-02-27 DIAGNOSIS — Z8659 Personal history of other mental and behavioral disorders: Secondary | ICD-10-CM | POA: Diagnosis not present

## 2015-02-27 DIAGNOSIS — W19XXXA Unspecified fall, initial encounter: Secondary | ICD-10-CM

## 2015-02-27 DIAGNOSIS — N390 Urinary tract infection, site not specified: Secondary | ICD-10-CM | POA: Diagnosis not present

## 2015-02-27 DIAGNOSIS — Z79899 Other long term (current) drug therapy: Secondary | ICD-10-CM | POA: Insufficient documentation

## 2015-02-27 DIAGNOSIS — Y998 Other external cause status: Secondary | ICD-10-CM | POA: Diagnosis not present

## 2015-02-27 DIAGNOSIS — N189 Chronic kidney disease, unspecified: Secondary | ICD-10-CM | POA: Insufficient documentation

## 2015-02-27 DIAGNOSIS — S0083XA Contusion of other part of head, initial encounter: Secondary | ICD-10-CM | POA: Diagnosis not present

## 2015-02-27 DIAGNOSIS — G309 Alzheimer's disease, unspecified: Secondary | ICD-10-CM | POA: Insufficient documentation

## 2015-02-27 DIAGNOSIS — S0990XA Unspecified injury of head, initial encounter: Secondary | ICD-10-CM

## 2015-02-27 LAB — CBC WITH DIFFERENTIAL/PLATELET
BASOS ABS: 0 10*3/uL (ref 0.0–0.1)
Basophils Relative: 0 % (ref 0–1)
EOS PCT: 0 % (ref 0–5)
Eosinophils Absolute: 0 10*3/uL (ref 0.0–0.7)
HEMATOCRIT: 38.1 % (ref 36.0–46.0)
HEMOGLOBIN: 12.1 g/dL (ref 12.0–15.0)
LYMPHS ABS: 1 10*3/uL (ref 0.7–4.0)
LYMPHS PCT: 11 % — AB (ref 12–46)
MCH: 30.7 pg (ref 26.0–34.0)
MCHC: 31.8 g/dL (ref 30.0–36.0)
MCV: 96.7 fL (ref 78.0–100.0)
MONOS PCT: 4 % (ref 3–12)
Monocytes Absolute: 0.4 10*3/uL (ref 0.1–1.0)
NEUTROS PCT: 85 % — AB (ref 43–77)
Neutro Abs: 7.8 10*3/uL — ABNORMAL HIGH (ref 1.7–7.7)
PLATELETS: 232 10*3/uL (ref 150–400)
RBC: 3.94 MIL/uL (ref 3.87–5.11)
RDW: 13.7 % (ref 11.5–15.5)
WBC: 9.2 10*3/uL (ref 4.0–10.5)

## 2015-02-27 LAB — BASIC METABOLIC PANEL
Anion gap: 8 (ref 5–15)
BUN: 27 mg/dL — AB (ref 6–23)
CHLORIDE: 106 mmol/L (ref 96–112)
CO2: 28 mmol/L (ref 19–32)
Calcium: 10.1 mg/dL (ref 8.4–10.5)
Creatinine, Ser: 0.68 mg/dL (ref 0.50–1.10)
GFR, EST AFRICAN AMERICAN: 88 mL/min — AB (ref >90)
GFR, EST NON AFRICAN AMERICAN: 76 mL/min — AB (ref >90)
Glucose, Bld: 125 mg/dL — ABNORMAL HIGH (ref 70–99)
Potassium: 3.7 mmol/L (ref 3.5–5.1)
SODIUM: 142 mmol/L (ref 135–145)

## 2015-02-27 LAB — URINALYSIS, ROUTINE W REFLEX MICROSCOPIC
Bilirubin Urine: NEGATIVE
Glucose, UA: NEGATIVE mg/dL
Ketones, ur: NEGATIVE mg/dL
Leukocytes, UA: NEGATIVE
Nitrite: POSITIVE — AB
Protein, ur: NEGATIVE mg/dL
Specific Gravity, Urine: 1.035 — ABNORMAL HIGH (ref 1.005–1.030)
Urobilinogen, UA: 0.2 mg/dL (ref 0.0–1.0)
pH: 5 (ref 5.0–8.0)

## 2015-02-27 LAB — URINE MICROSCOPIC-ADD ON

## 2015-02-27 MED ORDER — CEPHALEXIN 500 MG PO CAPS: 500.0000 mg | ORAL_CAPSULE | Freq: Four times a day (QID) | ORAL | Status: AC

## 2015-02-27 MED ORDER — DEXTROSE 5 % IV SOLN
1.0000 g | Freq: Once | INTRAVENOUS | Status: AC
  Administered 2015-02-27: 1 g via INTRAVENOUS
  Filled 2015-02-27: qty 10

## 2015-02-27 NOTE — ED Notes (Signed)
Patient transported to X-ray will do ekg when pt returns 
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
## 2015-02-27 NOTE — ED Notes (Signed)
Notified [facility] about transport for patient back to facility. Unknown estimated time of arrival.

## 2015-02-27 NOTE — ED Notes (Signed)
Bed: [id number] Expected date: 02/27/15 Expected time: 5:53 PM Means of arrival:  Comments: fall

## 2015-02-27 NOTE — Discharge Instructions (Signed)
Blunt Trauma You have been evaluated for injuries. You have been examined and your caregiver has not found injuries serious enough to require hospitalization. It is common to have multiple bruises and sore muscles following an accident. These tend to feel worse for the first 24 hours. You will feel more stiffness and soreness over the next several hours and worse when you wake up the first morning after your accident. After this point, you should begin to improve with each passing day. The amount of improvement depends on the amount of damage done in the accident. Following your accident, if some part of your body does not work as it should, or if the pain in any area continues to increase, you should return to the Emergency Department for re-evaluation.  HOME CARE INSTRUCTIONS  Routine care for sore areas should include:  Ice to sore areas every 2 hours for 20 minutes while awake for the next 2 days.  Drink extra fluids (not alcohol).  Take a hot or warm shower or bath once or twice a day to increase blood flow to sore muscles. This will help you "limber up".  Activity as tolerated. Lifting may aggravate neck or back pain.  Only take over-the-counter or prescription medicines for pain, discomfort, or fever as directed by your caregiver. Do not use aspirin. This may increase bruising or increase bleeding if there are small areas where this is happening. SEEK IMMEDIATE MEDICAL CARE IF:  Numbness, tingling, weakness, or problem with the use of your arms or legs.  A severe headache is not relieved with medications.  There is a change in bowel or bladder control.  Increasing pain in any areas of the body.  Short of breath or dizzy.  Nauseated, vomiting, or sweating.  Increasing belly (abdominal) discomfort.  Blood in urine, stool, or vomiting blood.  Pain in either shoulder in an area where a shoulder strap would be.  Feelings of lightheadedness or if you have a fainting  episode. Sometimes it is not possible to identify all injuries immediately after the trauma. It is important that you continue to monitor your condition after the emergency department visit. If you feel you are not improving, or improving more slowly than should be expected, call your physician. If you feel your symptoms (problems) are worsening, return to the Emergency Department immediately. Document Released: 09/12/2001 Document Revised: 03/10/2012 Document Reviewed: 08/04/2008 [facility] Patient Information 2015 [place], [place]. This information is not intended to replace advice given to you by your health care provider. Make sure you discuss any questions you have with your health care provider. Urinary Tract Infection Urinary tract infections (UTIs) can develop anywhere along your urinary tract. Your urinary tract is your body's drainage system for removing wastes and extra water. Your urinary tract includes two kidneys, two ureters, a bladder, and a urethra. Your kidneys are a pair of bean-shaped organs. Each kidney is about the size of your fist. They are located below your ribs, one on each side of your spine. CAUSES Infections are caused by microbes, which are microscopic organisms, including fungi, viruses, and bacteria. These organisms are so small that they can only be seen through a microscope. Bacteria are the microbes that most commonly cause UTIs. SYMPTOMS  Symptoms of UTIs may vary by age and gender of the patient and by the location of the infection. Symptoms in young women typically include a frequent and intense urge to urinate and a painful, burning feeling in the bladder or urethra during urination. Older women  and men are more likely to be tired, shaky, and weak and have muscle aches and abdominal pain. A fever may mean the infection is in your kidneys. Other symptoms of a kidney infection include pain in your back or sides below the ribs, nausea, and vomiting. DIAGNOSIS To diagnose  a UTI, your caregiver will ask you about your symptoms. Your caregiver also will ask to provide a urine sample. The urine sample will be tested for bacteria and white blood cells. White blood cells are made by your body to help fight infection. TREATMENT  Typically, UTIs can be treated with medication. Because most UTIs are caused by a bacterial infection, they usually can be treated with the use of antibiotics. The choice of antibiotic and length of treatment depend on your symptoms and the type of bacteria causing your infection. HOME CARE INSTRUCTIONS  If you were prescribed antibiotics, take them exactly as your caregiver instructs you. Finish the medication even if you feel better after you have only taken some of the medication.  Drink enough water and fluids to keep your urine clear or pale yellow.  Avoid caffeine, tea, and carbonated beverages. They tend to irritate your bladder.  Empty your bladder often. Avoid holding urine for long periods of time.  Empty your bladder before and after sexual intercourse.  After a bowel movement, women should cleanse from front to back. Use each tissue only once. SEEK MEDICAL CARE IF:   You have back pain.  You develop a fever.  Your symptoms do not begin to resolve within 3 days. SEEK IMMEDIATE MEDICAL CARE IF:   You have severe back pain or lower abdominal pain.  You develop chills.  You have nausea or vomiting.  You have continued burning or discomfort with urination. MAKE SURE YOU:   Understand these instructions.  Will watch your condition.  Will get help right away if you are not doing well or get worse. Document Released: 09/26/2005 Document Revised: 06/17/2012 Document Reviewed: 01/25/2012 [facility] Patient Information 2015 [place], [place]. This information is not intended to replace advice given to you by your health care provider. Make sure you discuss any questions you have with your health care provider.

## 2015-02-27 NOTE — ED Provider Notes (Signed)
CSN: [id number]     Arrival date & time 02/27/15  1803 History   First MD Initiated Contact with Patient 02/27/15 1816     Chief Complaint  Patient presents with  . Fall   HPI Patient is a resident of a nursing facility. She was sitting in her wheelchair when she fell out. The patient was bending forward and fell out of the chair striking her forehead on a carpeted floor. There was no loss of consciousness. Staff at the facility noted that she had a hematoma on her right side of her head. They sent her to the emergency room for evaluation. Patient's history is limited. She has dementia the patient mumbles but does not answer any questions. She is unable to provide any further information to me. Past Medical History  Diagnosis Date  . Subdural hematoma   . Alzheimer disease   . Osteoporosis   . Anxiety   . Chronic kidney disease    History reviewed. No pertinent past surgical history. History reviewed. No pertinent family history. History  Substance Use Topics  . Smoking status: Never Smoker   . Smokeless tobacco: Not on file  . Alcohol Use: No   OB History    No data available     Review of Systems  Unable to perform ROS     Allergies  Review of patient's allergies indicates no known allergies.  Home Medications   Prior to Admission medications   Medication Sig Start Date End Date Taking? Authorizing Provider  acetaminophen (TYLENOL) 325 MG tablet Take 650 mg by mouth 3 (three) times daily. [facility]   Yes Historical Provider, MD  aspirin 81 MG chewable tablet Chew 81 mg by mouth daily.   Yes Historical Provider, MD  donepezil (ARICEPT) 10 MG tablet Take 10 mg by mouth every evening.    Yes Historical Provider, MD  loperamide (IMODIUM) 2 MG capsule Take 4 mg by mouth as needed for diarrhea or loose stools.   Yes Historical Provider, MD  Protein POWD Take 2 scoop by mouth 3 (three) times daily. To 4 oz of milk/jiuce   Yes Historical Provider, MD  Skin  Protectants, Misc. (EUCERIN) cream Apply 1 application topically 3 (three) times a week. To the face and body   Yes Historical Provider, MD  cephALEXin (KEFLEX) 500 MG capsule Take 1 capsule (500 mg total) by mouth 4 (four) times daily. 02/27/15   Linwood DibblesJon Rea Kalama, MD   BP 111/51 mmHg  Pulse 76  Temp(Src) 97.8 F (36.6 C) (Axillary)  Resp 20  SpO2 98% Physical Exam  Constitutional: No distress.  Frail, elderly  HENT:  Head: Normocephalic.  Right Ear: External ear normal.  Left Ear: External ear normal.  Small hematoma right forehead  Eyes: Conjunctivae are normal. Right eye exhibits no discharge. Left eye exhibits no discharge. No scleral icterus.  Neck: Neck supple. No spinous process tenderness and no muscular tenderness present. No tracheal deviation present.  The patient is in a cervical spine collar no spinous process tenderness  Cardiovascular: Normal rate, regular rhythm and intact distal pulses.   Pulmonary/Chest: Effort normal and breath sounds normal. No stridor. No respiratory distress. She has no wheezes. She has no rales.  Abdominal: Soft. Bowel sounds are normal. She exhibits no distension. There is no tenderness. There is no rebound and no guarding.  Musculoskeletal: She exhibits no edema or tenderness.       Thoracic back: Normal.       Lumbar back:  Normal.  Able to range her upper extremities and lower extremities, patient does not appear to have any pain or discomfort with that movement no gross deformity is noted  Neurological: She is alert. She has normal strength. No cranial nerve deficit (no facial droop, extraocular movements intact, no slurred speech) or sensory deficit. She exhibits normal muscle tone. She displays no seizure activity. Coordination normal.  Skin: Skin is warm and dry. No rash noted. She is not diaphoretic.  Psychiatric: She has a normal mood and affect.  Nursing note and vitals reviewed.   ED Course  Procedures (including critical care time) Labs  Review Labs Reviewed  CBC WITH DIFFERENTIAL/PLATELET - Abnormal; Notable for the following:    Neutrophils Relative % 85 (*)    Neutro Abs 7.8 (*)    Lymphocytes Relative 11 (*)    All other components within normal limits  BASIC METABOLIC PANEL - Abnormal; Notable for the following:    Glucose, Bld 125 (*)    BUN 27 (*)    GFR calc non Af Amer 76 (*)    GFR calc Af Amer 88 (*)    All other components within normal limits  URINALYSIS, ROUTINE W REFLEX MICROSCOPIC - Abnormal; Notable for the following:    Color, Urine AMBER (*)    APPearance CLOUDY (*)    Specific Gravity, Urine 1.035 (*)    Hgb urine dipstick MODERATE (*)    Nitrite POSITIVE (*)    All other components within normal limits  URINE MICROSCOPIC-ADD ON - Abnormal; Notable for the following:    Bacteria, UA MANY (*)    Crystals CA OXALATE CRYSTALS (*)    All other components within normal limits    Imaging Review Ct Head Wo Contrast  02/27/2015   CLINICAL DATA:  Fall from wheelchair with head and neck injury, initial encounter  EXAM: CT HEAD WITHOUT CONTRAST  CT CERVICAL SPINE WITHOUT CONTRAST  TECHNIQUE: Multidetector CT imaging of the head and cervical spine was performed following the standard protocol without intravenous contrast. Multiplanar CT image reconstructions of the cervical spine were also generated.  COMPARISON:  11/06/2013  FINDINGS: CT HEAD FINDINGS  Bony calvarium is intact. Mild soft tissue swelling is noted in the right frontal region consistent with a recent injury. Diffuse atrophic changes are noted. No findings to suggest acute hemorrhage, acute infarction or space-occupying mass lesion are seen. Chronic white matter ischemic changes noted.  CT CERVICAL SPINE FINDINGS  Seven cervical segments are well visualized. Vertebral body height is well maintained. Mild osteophytic changes are noted as well as degenerative facet changes. No findings to suggest acute fracture or acute facet abnormality are noted. The  surrounding soft tissues and lung apices are within normal limits. No acute abnormality is noted. An old right clavicular fracture is seen.  IMPRESSION: CT of the head: Chronic atrophic and ischemic change. No acute abnormality is noted.  CT of the cervical spine: Chronic changes without acute abnormality.   Electronically Signed   By: Alcide Clever M.D.   On: 02/27/2015 19:44   Ct Cervical Spine Wo Contrast  02/27/2015   CLINICAL DATA:  Fall from wheelchair with head and neck injury, initial encounter  EXAM: CT HEAD WITHOUT CONTRAST  CT CERVICAL SPINE WITHOUT CONTRAST  TECHNIQUE: Multidetector CT imaging of the head and cervical spine was performed following the standard protocol without intravenous contrast. Multiplanar CT image reconstructions of the cervical spine were also generated.  COMPARISON:  11/06/2013  FINDINGS: CT HEAD FINDINGS  Bony calvarium is intact. Mild soft tissue swelling is noted in the right frontal region consistent with a recent injury. Diffuse atrophic changes are noted. No findings to suggest acute hemorrhage, acute infarction or space-occupying mass lesion are seen. Chronic white matter ischemic changes noted.  CT CERVICAL SPINE FINDINGS  Seven cervical segments are well visualized. Vertebral body height is well maintained. Mild osteophytic changes are noted as well as degenerative facet changes. No findings to suggest acute fracture or acute facet abnormality are noted. The surrounding soft tissues and lung apices are within normal limits. No acute abnormality is noted. An old right clavicular fracture is seen.  IMPRESSION: CT of the head: Chronic atrophic and ischemic change. No acute abnormality is noted.  CT of the cervical spine: Chronic changes without acute abnormality.   Electronically Signed   By: Alcide Clever M.D.   On: 02/27/2015 19:44   Dg Hips Bilat With Pelvis 2v  02/27/2015   CLINICAL DATA:  Witnessed fall out of wheelchair today. Hit head on floor. Hematoma the right  side of head.  EXAM: BILATERAL HIP (WITH PELVIS) 2 VIEWS  COMPARISON:  None.  FINDINGS: Evidence of prior trauma and internal fixation within the left proximal femur. No acute fracture, subluxation or dislocation. SI joints are symmetric. Diffuse osteopenia. Degenerative changes in the visualized lower lumbar spine.  IMPRESSION: Postoperative and posttraumatic changes in the left hip. No acute bony abnormality.   Electronically Signed   By: Charlett Nose M.D.   On: 02/27/2015 19:32     EKG Interpretation   Date/Time:  Sunday February 27 2015 19:44:22 EST Ventricular Rate:  66 PR Interval:  162 QRS Duration: 92 QT Interval:  408 QTC Calculation: 427 R Axis:   71 Text Interpretation:  Sinus rhythm Confirmed by COOK  MD, BRIAN ([id number]) on  02/27/2015 7:56:17 PM      MDM   Final diagnoses:  Fall  Head injury  UTI (lower urinary tract infection)   No serious injuries associated with her fall.  She does have a uti.  Given iv dose of abx.  dC  Back to nursing facility on oral abx.    Linwood Dibbles, MD 02/27/15 2152

## 2015-04-01 DEATH — deceased

## 2016-10-05 IMAGING — CT CT HEAD W/O CM
3 series · 17 of 30 positions shown, 19 images · non-contrast
Comparison: 11/06/2013

CLINICAL DATA: Fall from wheelchair with head and neck injury,
initial encounter

EXAM:
CT HEAD WITHOUT CONTRAST
CT CERVICAL SPINE WITHOUT CONTRAST
TECHNIQUE: Multidetector CT imaging of the head and cervical spine was
performed following the standard protocol without intravenous
contrast. Multiplanar CT image reconstructions of the cervical spine
were also generated.

[Series 2: head w/o · axial · non-contrast · 0.43mm/px · z∈[-165,-65]mm · 5 of 32 slices shown, 7 images]
[im 6/32  brain]
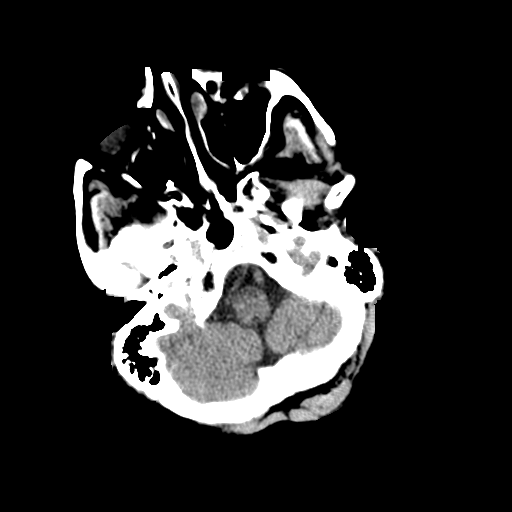
[im 6/32  bone]
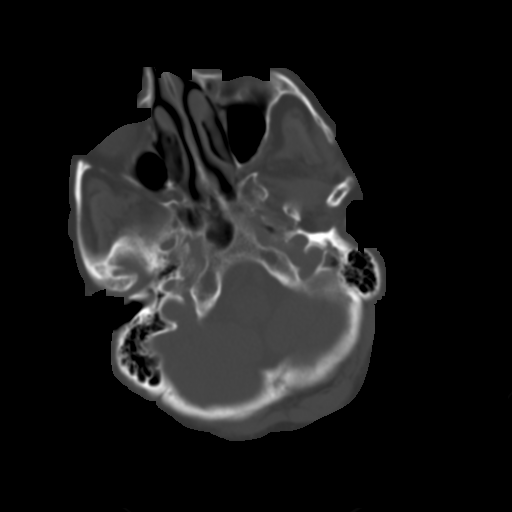
[im 11/32  brain]
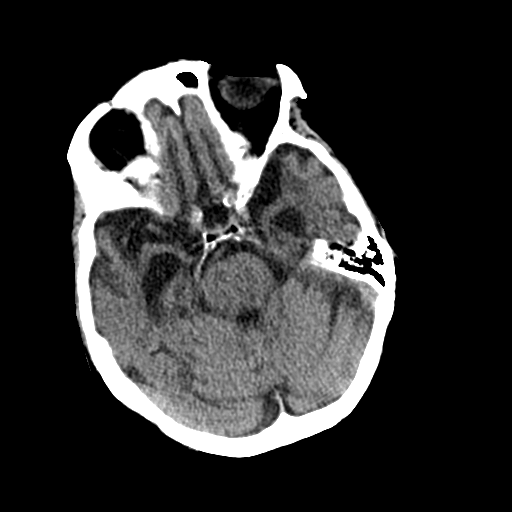
[im 16/32  brain]
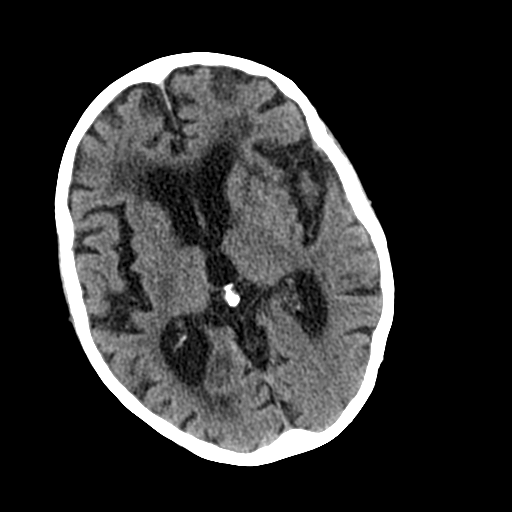
[im 21/32  brain]
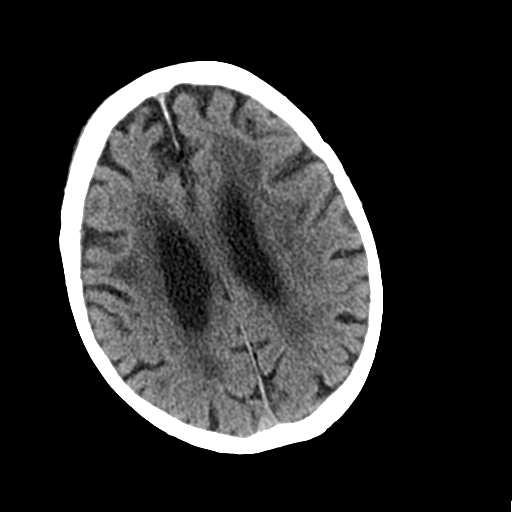
[im 26/32  brain]
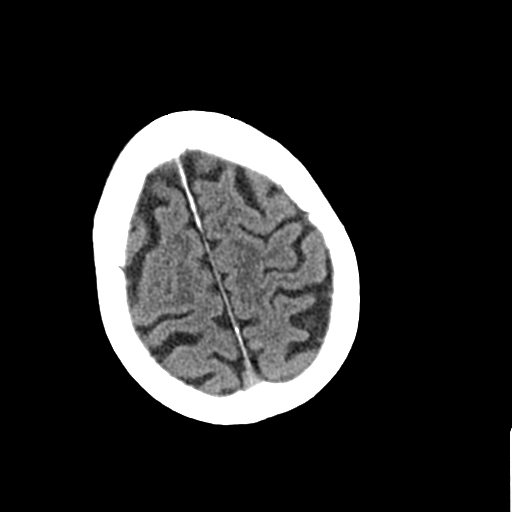
[im 26/32  bone]
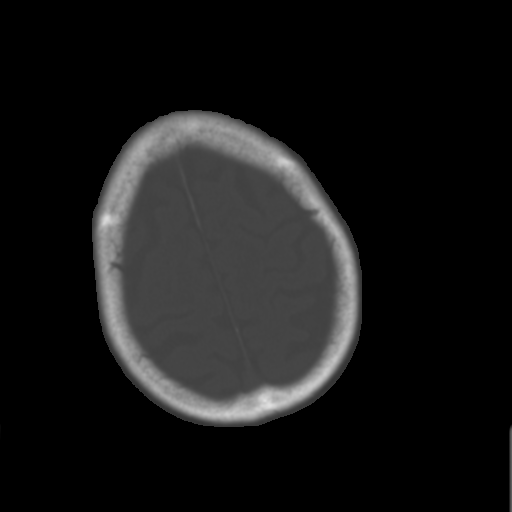

[Series 3: bone windows · axial · 0.43mm/px · z∈[-165,-65]mm · 5 of 32 slices shown]
[im 6/32  bone]
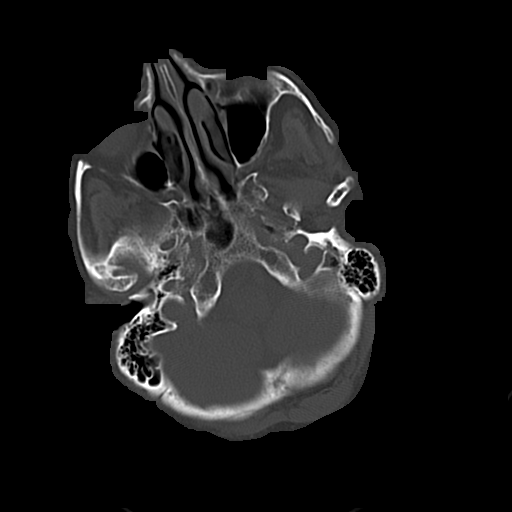
[im 11/32  bone]
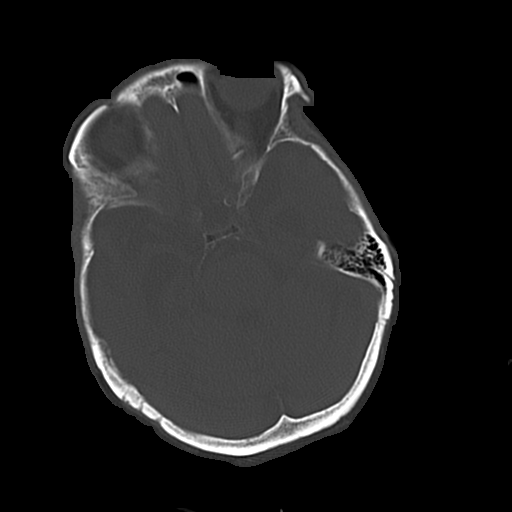
[im 16/32  bone]
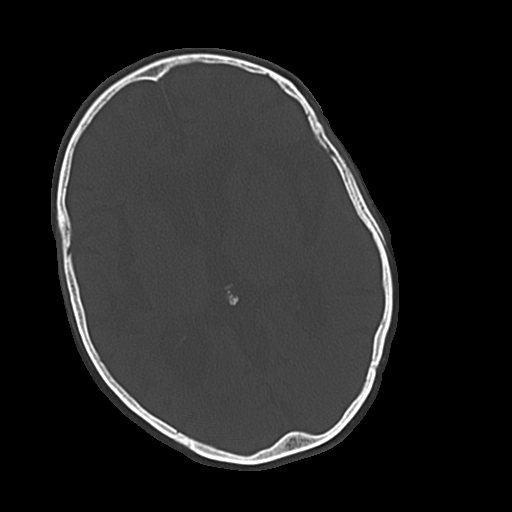
[im 21/32  bone]
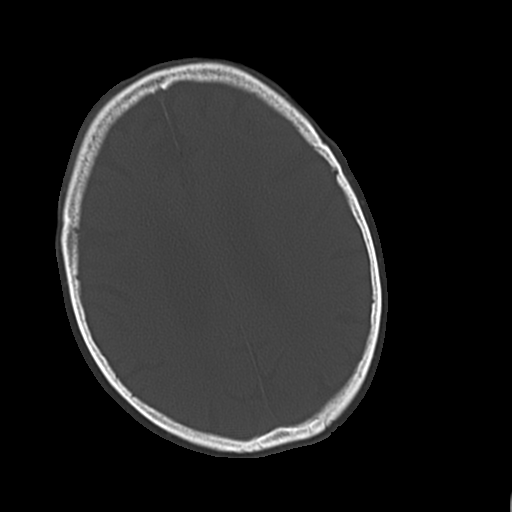
[im 26/32  bone]
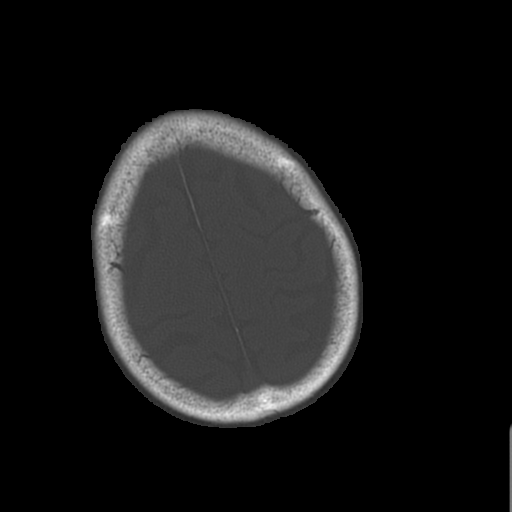

[Series 4: c-spine st · axial · 0.31mm/px · z∈[-300,-194]mm · 7 of 73 slices shown]
[im 5/73  brain]
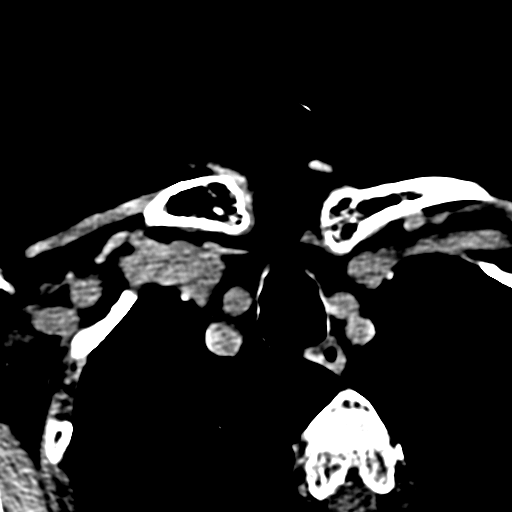
[im 15/73  brain]
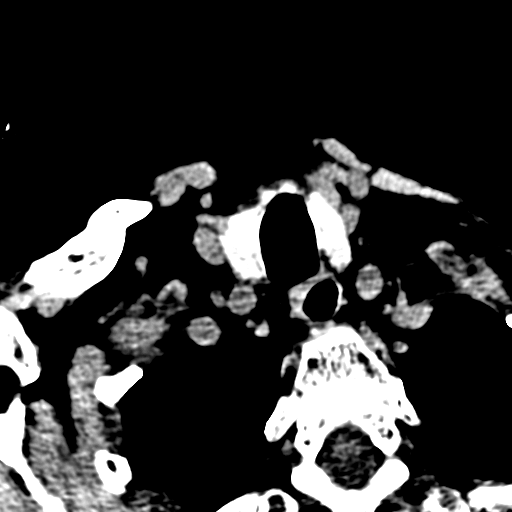
[im 25/73  brain]
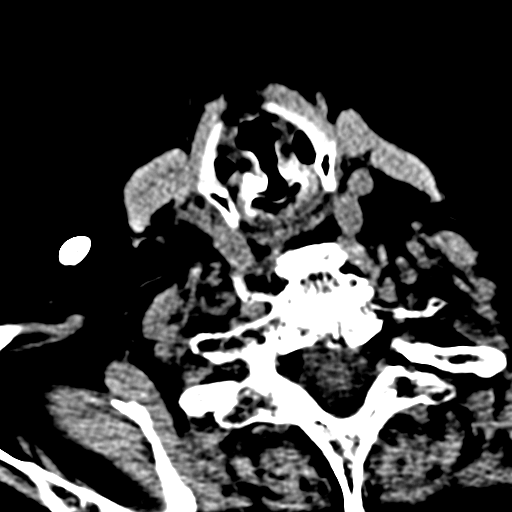
[im 34/73  brain]
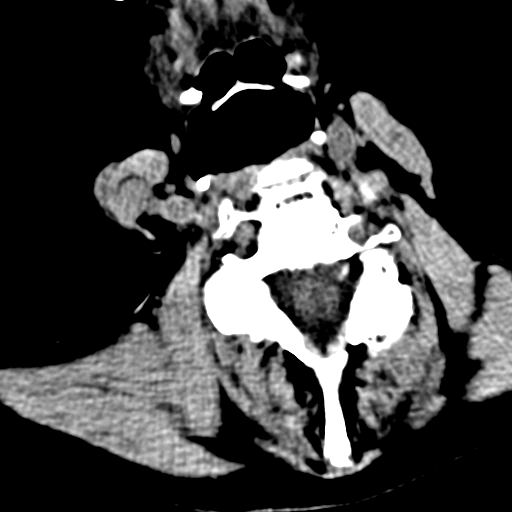
[im 39/73  brain]
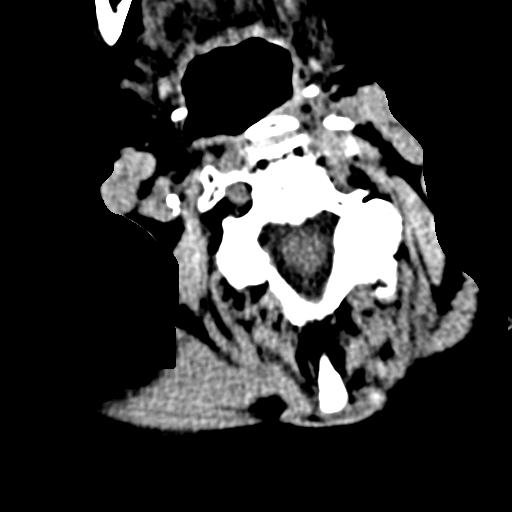
[im 49/73  brain]
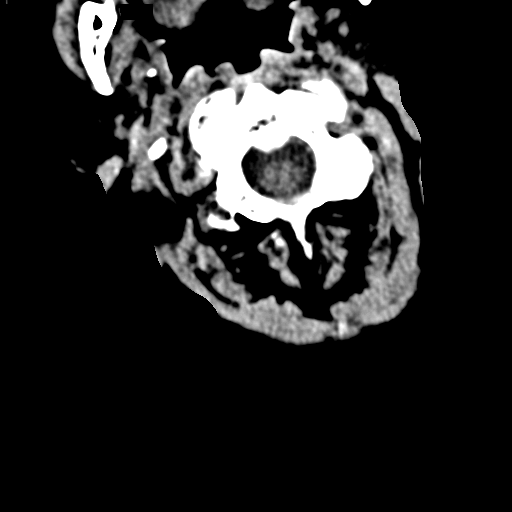
[im 58/73  brain]
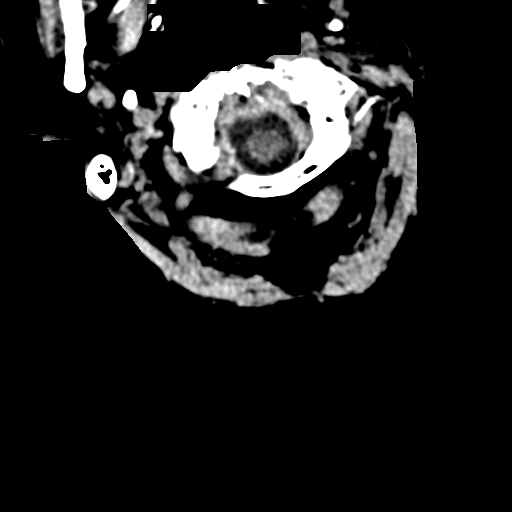

[17 of 30 positions shown; findings below may reference images not displayed]

FINDINGS: CT HEAD FINDINGS

Bony calvarium is intact. Mild soft tissue swelling is noted in the
right frontal region consistent with a recent injury. Diffuse
atrophic changes are noted. No findings to suggest acute hemorrhage,
acute infarction or space-occupying mass lesion are seen. Chronic
white matter ischemic changes noted.

CT CERVICAL SPINE FINDINGS

Seven cervical segments are well visualized. Vertebral body height
is well maintained. Mild osteophytic changes are noted as well as
degenerative facet changes. No findings to suggest acute fracture or
acute facet abnormality are noted. The surrounding soft tissues and
lung apices are within normal limits. No acute abnormality is noted.
An old right clavicular fracture is seen.
IMPRESSION: CT of the head: Chronic atrophic and ischemic change. No acute
abnormality is noted.

CT of the cervical spine: Chronic changes without acute abnormality.
